# Patient Record
Sex: Male | Born: 2000 | Race: Black or African American | Hispanic: No | Marital: Single | Smoking: Never smoker
Health system: Southern US, Community
[De-identification: ages and names within clinical notes are randomized; demographics above are authoritative.]

## PROBLEM LIST (undated history)

## (undated) DIAGNOSIS — R569 Unspecified convulsions: Secondary | ICD-10-CM

---
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
## 2018-05-28 ENCOUNTER — Encounter (HOSPITAL_BASED_OUTPATIENT_CLINIC_OR_DEPARTMENT_OTHER): Payer: Self-pay | Admitting: *Deleted

## 2018-05-28 ENCOUNTER — Emergency Department (HOSPITAL_BASED_OUTPATIENT_CLINIC_OR_DEPARTMENT_OTHER): Payer: Medicaid Other

## 2018-05-28 ENCOUNTER — Emergency Department (HOSPITAL_BASED_OUTPATIENT_CLINIC_OR_DEPARTMENT_OTHER)
Admission: EM | Admit: 2018-05-28 | Discharge: 2018-05-28 | Disposition: A | Discharge status: Home / Self Care | Payer: Medicaid Other | Attending: Emergency Medicine | Admitting: Emergency Medicine

## 2018-05-28 ENCOUNTER — Other Ambulatory Visit: Payer: Self-pay

## 2018-05-28 DIAGNOSIS — J36 Peritonsillar abscess: Secondary | ICD-10-CM | POA: Diagnosis not present

## 2018-05-28 DIAGNOSIS — J029 Acute pharyngitis, unspecified: Secondary | ICD-10-CM | POA: Diagnosis present

## 2018-05-28 DIAGNOSIS — J039 Acute tonsillitis, unspecified: Secondary | ICD-10-CM

## 2018-05-28 DIAGNOSIS — Z79899 Other long term (current) drug therapy: Secondary | ICD-10-CM | POA: Diagnosis not present

## 2018-05-28 HISTORY — DX: Unspecified convulsions: R56.9

## 2018-05-28 LAB — CBC WITH DIFFERENTIAL/PLATELET
BASOS ABS: 0 10*3/uL (ref 0.0–0.1)
Basophils Relative: 0 %
EOS ABS: 0 10*3/uL (ref 0.0–0.7)
Eosinophils Relative: 0 %
HCT: 45.3 % (ref 39.0–52.0)
HEMOGLOBIN: 16.4 g/dL (ref 13.0–17.0)
Lymphocytes Relative: 7 %
Lymphs Abs: 1.3 10*3/uL (ref 0.7–4.0)
MCH: 29 pg (ref 26.0–34.0)
MCHC: 36.2 g/dL — AB (ref 30.0–36.0)
MCV: 80 fL (ref 78.0–100.0)
MONOS PCT: 8 %
Monocytes Absolute: 1.5 10*3/uL — ABNORMAL HIGH (ref 0.1–1.0)
NEUTROS ABS: 15.6 10*3/uL — AB (ref 1.7–7.7)
Neutrophils Relative %: 85 %
Platelets: 299 10*3/uL (ref 150–400)
RBC: 5.66 MIL/uL (ref 4.22–5.81)
RDW: 11.8 % (ref 11.5–15.5)
WBC: 18.4 10*3/uL — AB (ref 4.0–10.5)

## 2018-05-28 LAB — BASIC METABOLIC PANEL
Anion gap: 10 (ref 5–15)
BUN: 11 mg/dL (ref 6–20)
CALCIUM: 9.9 mg/dL (ref 8.9–10.3)
CO2: 27 mmol/L (ref 22–32)
Chloride: 100 mmol/L (ref 98–111)
Creatinine, Ser: 1.3 mg/dL — ABNORMAL HIGH (ref 0.61–1.24)
GLUCOSE: 102 mg/dL — AB (ref 70–99)
POTASSIUM: 3.9 mmol/L (ref 3.5–5.1)
SODIUM: 137 mmol/L (ref 135–145)

## 2018-05-28 LAB — RAPID STREP SCREEN (MED CTR MEBANE ONLY): STREPTOCOCCUS, GROUP A SCREEN (DIRECT): NEGATIVE

## 2018-05-28 MED ORDER — DEXAMETHASONE SODIUM PHOSPHATE 10 MG/ML IJ SOLN
10.0000 mg | Freq: Once | INTRAMUSCULAR | Status: AC
  Administered 2018-05-28: 10 mg via INTRAVENOUS
  Filled 2018-05-28: qty 1

## 2018-05-28 MED ORDER — CLINDAMYCIN HCL 150 MG PO CAPS: 450.0000 mg | ORAL_CAPSULE | Freq: Three times a day (TID) | ORAL | 0 refills | Status: AC

## 2018-05-28 MED ORDER — IOPAMIDOL (ISOVUE-300) INJECTION 61%
100.0000 mL | Freq: Once | INTRAVENOUS | Status: AC | PRN
  Administered 2018-05-28: 75 mL via INTRAVENOUS

## 2018-05-28 MED ORDER — SODIUM CHLORIDE 0.9 % IV BOLUS
1000.0000 mL | Freq: Once | INTRAVENOUS | Status: AC
  Administered 2018-05-28: 1000 mL via INTRAVENOUS

## 2018-05-28 MED ORDER — CLINDAMYCIN PHOSPHATE 600 MG/50ML IV SOLN
600.0000 mg | Freq: Once | INTRAVENOUS | Status: AC
  Administered 2018-05-28: 600 mg via INTRAVENOUS

## 2018-05-28 NOTE — ED Triage Notes (Signed)
Pt c/o sore throat x 6 days

## 2018-05-28 NOTE — ED Notes (Signed)
Request from radiology for cd of ct scan

## 2018-05-28 NOTE — ED Notes (Signed)
Patient transported to CT 

## 2018-05-28 NOTE — ED Notes (Signed)
ED Provider at bedside. 

## 2018-05-28 NOTE — ED Notes (Signed)
Family at bedside. 

## 2018-05-28 NOTE — ED Provider Notes (Signed)
[facility] EMERGENCY DEPARTMENT Provider Note   CSN: [id number] Arrival date & time: 05/28/18  1746     History   Chief Complaint Chief Complaint  Patient presents with  . Sore Throat    HPI Dylan Rivera is a 17 y.o. male with past medical history of seizures who presents for evaluation of sore throat x6 days.  Patient reports the pain has been grossly worsened over the last 6 days prompting ED visit.  He states he is taking Tylenol which is provided minimal improvement of symptoms.  Patient reports that over the last few days, symptoms have worsened.  He states that it is painful to swallow and states that he has had to spit out his secretions occasionally because he has difficulty swallowing them.  He states he has had decreased appetite secondary to pain with swallowing.  He also reports difficulty opening his mouth secondary to symptoms.  Mom reports changes in his voice over the last few days.  He states he has had some subjective fever but has not measured anything at home.  Patient denies any difficulty breathing.  The history is provided by the patient.    Past Medical History:  Diagnosis Date  . Seizures (HCC)     There are no active problems to display for this patient.   History reviewed. No pertinent surgical history.      Home Medications    Prior to Admission medications   Medication Sig Start Date End Date Taking? Authorizing Provider  acetaminophen (TYLENOL) 500 MG tablet Take 500 mg by mouth every 6 (six) hours as needed.   Yes [provider]  fluticasone (FLONASE) 50 MCG/ACT nasal spray Place 2 sprays into both nostrils daily.   Yes [provider]  clindamycin (CLEOCIN) 150 MG capsule Take 3 capsules (450 mg total) by mouth 3 (three) times daily for 7 days. 05/28/18 06/04/18  Maxwell CaulLayden, Lindsey A, PA-C    Family History History reviewed. No pertinent family history.  Social History Social History   Tobacco Use  . Smoking  status: Never Smoker  . Smokeless tobacco: Never Used  Substance Use Topics  . Alcohol use: Never    Frequency: Never  . Drug use: Never     Allergies   Patient has no known allergies.   Review of Systems Review of Systems  Constitutional: Positive for appetite change. Negative for fever.  HENT: Positive for drooling, sore throat, trouble swallowing and voice change.   Respiratory: Negative for shortness of breath.   Gastrointestinal: Negative for vomiting.     Physical Exam Updated Vital Signs BP (!) 142/83 (BP Location: Left Arm)   Pulse 85   Temp 99 F (37.2 C)   Resp 16   Ht 6\' 2"  (1.88 m)   Wt 83.9 kg (185 lb)   SpO2 100%   BMI 23.75 kg/m   Physical Exam  Constitutional: He appears well-developed and well-nourished.  HENT:  Head: Normocephalic and atraumatic.  Mouth/Throat: Posterior oropharyngeal edema and posterior oropharyngeal erythema present.  Phonation sounds slightly muffled.  Airways patent.  Minute evaluation of posterior oropharynx secondary to patient's cooperation with exam but does appear slightly edematous and erythematous.  Question deviation of uvula.  Tongue or lip swelling.  Eyes: Conjunctivae and EOM are normal. Right eye exhibits no discharge. Left eye exhibits no discharge. No scleral icterus.  Pulmonary/Chest: Effort normal.  Neurological: He is alert.  Skin: Skin is warm and dry.  Psychiatric: He has a normal mood and  affect. His speech is normal and behavior is normal.  Nursing note and vitals reviewed.    ED Treatments / Results  Labs (all labs ordered are listed, but only abnormal results are displayed) Labs Reviewed  BASIC METABOLIC PANEL - Abnormal; Notable for the following components:      Result Value   Glucose, Bld 102 (*)    Creatinine, Ser 1.30 (*)    All other components within normal limits  CBC WITH DIFFERENTIAL/PLATELET - Abnormal; Notable for the following components:   WBC 18.4 (*)    MCHC 36.2 (*)    Neutro  Abs 15.6 (*)    Monocytes Absolute 1.5 (*)    All other components within normal limits  RAPID STREP SCREEN ([facility] ONLY)  CULTURE, GROUP A STREP [facility])    EKG None  Radiology Ct Soft Tissue Neck W Contrast  Result Date: 05/28/2018 CLINICAL DATA:  Throat pain and difficulty swallowing. EXAM: CT NECK WITH CONTRAST TECHNIQUE: Multidetector CT imaging of the neck was performed using the standard protocol following the bolus administration of intravenous contrast. CONTRAST:  75mL ISOVUE-300 IOPAMIDOL (ISOVUE-300) INJECTION 61% COMPARISON:  Head CT 12/06/2017 FINDINGS: PHARYNX AND LARYNX: --Nasopharynx: Fossae of Rosenmuller are clear. Normal adenoid tonsils for age. --Oral cavity and oropharynx: The left palatine tonsil is markedly enlarged with heterogeneous internal density including a developing collection measuring 1.8 x 1.0 cm. Lingual tonsils are also enlarged, but without fluid collection. --Hypopharynx: Normal vallecula and pyriform sinuses. --Larynx: Normal epiglottis and pre-epiglottic space. Normal aryepiglottic and vocal folds. --Retropharyngeal space: No abscess, effusion or lymphadenopathy. SALIVARY GLANDS: --Parotid: No mass lesion or inflammation. No sialolithiasis or ductal dilatation. --Submandibular: Symmetric without inflammation. No sialolithiasis or ductal dilatation. --Sublingual: Normal. No ranula or other visible lesion of the base of tongue and floor of mouth. THYROID: Normal. LYMPH NODES: Reactive level 2A lymph nodes measure up to 10 mm. VASCULAR: Major cervical vessels are patent. LIMITED INTRACRANIAL: Normal. VISUALIZED ORBITS: Normal. MASTOIDS AND VISUALIZED PARANASAL SINUSES: No fluid levels or advanced mucosal thickening. No mastoid effusion. SKELETON: No bony spinal canal stenosis. No lytic or blastic lesions. UPPER CHEST: Clear. OTHER: None. IMPRESSION: Left palatine tonsillitis with 18 x 10 mm collection, consistent with developing peritonsillar abscess. No airway  compromise. Electronically Signed   By: Deatra Robinson M.D.   On: 05/28/2018 19:56    Procedures Procedures (including critical care time)  Medications Ordered in ED Medications  dexamethasone (DECADRON) injection 10 mg (10 mg Intravenous Given 05/28/18 1845)  sodium chloride 0.9 % bolus 1,000 mL (0 mLs Intravenous Stopped 05/28/18 2015)  iopamidol (ISOVUE-300) 61 % injection 100 mL (75 mLs Intravenous Contrast Given 05/28/18 1904)  clindamycin (CLEOCIN) IVPB 600 mg (0 mg Intravenous Stopped 05/28/18 2034)     Initial Impression / Assessment and Plan / ED Course  I have reviewed the triage vital signs and the nursing notes.  Pertinent labs & imaging results that were available during my care of the patient were reviewed by me and considered in my medical decision making (see chart for details).     17 year old male who presents for evaluation of sore throat x6 days.  Reports subjective fevers at home but states he has not measured a temperature.  Reports he has had some difficulty swallowing and states over last few days, he has had to spit out his secretions.  Mom reports that he has had a voice change.  No difficulty breathing, vomiting. Patient is afebrile, non-toxic appearing, sitting comfortably on examination table.  Vital signs reviewed and stable.  On exam, limited evaluation of posterior oropharynx secondary this patient's ability to cooperate with exam.  On my limited evaluation, posterior oropharynx appears erythematous and edematous.  Question deviation of the uvula.  Oral angioedema.  Phonation does sound slightly muffled.  Consider rapid strep.  Also consider peritonsillar abscess.  History/physical exam is not concerning for Ludwig angina.  Rapid strep ordered at triage.  Will also proceed with basic labs, CT soft tissue of neck for evaluation of peritonsillar abscess.  Rapid strep negative.  CBC shows leukocytosis of 18.4.  BMP shows creatinine at 1.30.  Otherwise  unremarkable.  CT soft tissue neck shows left tonsillitis with developing 1.8 x 1.0 cm collection consistent with developing peritonsillar abscess.  Updated patient on results.  He reports improvement in symptoms after fluids and steroids.  P.o. challenge patient.  He is able to swallow his secretions and swallow liquids without any difficulty.  Will start patient on IV clindamycin.  Vital signs are stable.  Patient is tolerating secretions and liquids here in the department.  Do not feel that patient needs admission or requires immediate drainage of the peritonsillar abscess.  Will consult ENT for further recommendation and outpatient follow-up.  Discussed patient with Dr. Pollyann Kennedy (ENT).  Recommend starting patient on p.o. clindamycin and will have him follow-up in his office tomorrow.  Updated patient on plan.  He is agreeable.  Patient is tolerating PO fluids here in the department without any difficulty.  Airway is patent.  He reports improvement in symptoms after steroids here in the department.  Instructed patient to follow-up with referred ENT doctor tomorrow morning.  Patient is agreeable.  Vital signs are stable.  Patient stable for discharge at this time.  We will plan to send home with p.o. clindamycin for further treatment. Patient had ample opportunity for questions and discussion. All patient's questions were answered with full understanding. Strict return precautions discussed. Patient expresses understanding and agreement to plan.   Final Clinical Impressions(s) / ED Diagnoses   Final diagnoses:  Tonsillitis  Peritonsillar abscess    ED Discharge Orders        Ordered    clindamycin (CLEOCIN) 150 MG capsule  3 times daily     05/28/18 2037       Maxwell Caul, PA-C 05/28/18 2055    Melene Plan, DO 05/28/18 2112

## 2018-05-28 NOTE — Discharge Instructions (Addendum)
You can take Tylenol or Ibuprofen as directed for pain. You can alternate Tylenol and Ibuprofen every 4 hours. If you take Tylenol at 1pm, then you can take Ibuprofen at 5pm. Then you can take Tylenol again at 9pm.   Take antibiotics as directed. Please take all of your antibiotics until finished.  Follow-up with referred ENT doctor tomorrow.  Call their office and arrange for an appointment.  Return the emergency department for any fever, difficulty breathing, difficulty swallowing your saliva, difficulty swallowing any food or liquids, worsening pain or any other worsening or concerning symptoms.

## 2018-05-31 LAB — CULTURE, GROUP A STREP (THRC)

## 2018-08-24 ENCOUNTER — Other Ambulatory Visit: Payer: Self-pay

## 2018-08-24 ENCOUNTER — Encounter (HOSPITAL_COMMUNITY)
Admission: EM | Disposition: A | Discharge status: Home / Self Care | Payer: Self-pay | Source: Home / Self Care | Attending: Emergency Medicine

## 2018-08-24 ENCOUNTER — Ambulatory Visit (HOSPITAL_BASED_OUTPATIENT_CLINIC_OR_DEPARTMENT_OTHER)
Admission: EM | Admit: 2018-08-24 | Discharge: 2018-08-24 | Disposition: A | Discharge status: Home / Self Care | Payer: Medicaid Other | Attending: Emergency Medicine | Admitting: Emergency Medicine

## 2018-08-24 ENCOUNTER — Emergency Department (HOSPITAL_BASED_OUTPATIENT_CLINIC_OR_DEPARTMENT_OTHER): Payer: Medicaid Other

## 2018-08-24 ENCOUNTER — Emergency Department (HOSPITAL_COMMUNITY): Payer: Medicaid Other | Admitting: Certified Registered"

## 2018-08-24 ENCOUNTER — Encounter (HOSPITAL_BASED_OUTPATIENT_CLINIC_OR_DEPARTMENT_OTHER): Payer: Self-pay | Admitting: Emergency Medicine

## 2018-08-24 DIAGNOSIS — K651 Peritoneal abscess: Secondary | ICD-10-CM

## 2018-08-24 DIAGNOSIS — J36 Peritonsillar abscess: Secondary | ICD-10-CM | POA: Diagnosis not present

## 2018-08-24 DIAGNOSIS — J029 Acute pharyngitis, unspecified: Secondary | ICD-10-CM | POA: Diagnosis present

## 2018-08-24 HISTORY — PX: INCISION AND DRAINAGE OF PERITONSILLAR ABCESS: SHX6257

## 2018-08-24 LAB — CBC WITH DIFFERENTIAL/PLATELET
BASOS ABS: 0 10*3/uL (ref 0.0–0.1)
BASOS PCT: 0 %
Eosinophils Absolute: 0.1 10*3/uL (ref 0.0–0.7)
Eosinophils Relative: 0 %
HCT: 47.5 % (ref 39.0–52.0)
Hemoglobin: 16.7 g/dL (ref 13.0–17.0)
LYMPHS PCT: 7 %
Lymphs Abs: 1.2 10*3/uL (ref 0.7–4.0)
MCH: 29.1 pg (ref 26.0–34.0)
MCHC: 35.2 g/dL (ref 30.0–36.0)
MCV: 82.9 fL (ref 78.0–100.0)
Monocytes Absolute: 2.4 10*3/uL — ABNORMAL HIGH (ref 0.1–1.0)
Monocytes Relative: 13 %
Neutro Abs: 14.5 10*3/uL — ABNORMAL HIGH (ref 1.7–7.7)
Neutrophils Relative %: 80 %
Platelets: 288 10*3/uL (ref 150–400)
RBC: 5.73 MIL/uL (ref 4.22–5.81)
RDW: 12 % (ref 11.5–15.5)
WBC: 18.2 10*3/uL — AB (ref 4.0–10.5)

## 2018-08-24 LAB — BASIC METABOLIC PANEL
ANION GAP: 8 (ref 5–15)
BUN: 13 mg/dL (ref 6–20)
CALCIUM: 9.7 mg/dL (ref 8.9–10.3)
CO2: 29 mmol/L (ref 22–32)
Chloride: 99 mmol/L (ref 98–111)
Creatinine, Ser: 0.97 mg/dL (ref 0.61–1.24)
GFR calc non Af Amer: >60 mL/min (ref >60)
GLUCOSE: 90 mg/dL (ref 70–99)
POTASSIUM: 3.9 mmol/L (ref 3.5–5.1)
SODIUM: 136 mmol/L (ref 135–145)

## 2018-08-24 LAB — GROUP A STREP BY PCR: GROUP A STREP BY PCR: NOT DETECTED

## 2018-08-24 SURGERY — INCISION AND DRAINAGE, ABSCESS, PERITONSILLAR
Anesthesia: General | Laterality: Left

## 2018-08-24 MED ORDER — CLINDAMYCIN PHOSPHATE 600 MG/50ML IV SOLN
600.0000 mg | Freq: Once | INTRAVENOUS | Status: AC
  Administered 2018-08-24: 600 mg via INTRAVENOUS
  Filled 2018-08-24: qty 50

## 2018-08-24 MED ORDER — DEXAMETHASONE SODIUM PHOSPHATE 10 MG/ML IJ SOLN
10.0000 mg | Freq: Once | INTRAMUSCULAR | Status: AC
  Administered 2018-08-24: 10 mg via INTRAVENOUS
  Filled 2018-08-24: qty 1

## 2018-08-24 MED ORDER — HYDROCODONE-ACETAMINOPHEN 7.5-325 MG/15ML PO SOLN: 10.0000 mL | ORAL | Status: DC | PRN

## 2018-08-24 MED ORDER — PROPOFOL 10 MG/ML IV BOLUS
INTRAVENOUS | Status: DC | PRN
  Administered 2018-08-24: 170 mg via INTRAVENOUS

## 2018-08-24 MED ORDER — SODIUM CHLORIDE 0.9 % IV BOLUS
1000.0000 mL | Freq: Once | INTRAVENOUS | Status: AC
  Administered 2018-08-24: 1000 mL via INTRAVENOUS

## 2018-08-24 MED ORDER — FENTANYL CITRATE (PF) 250 MCG/5ML IJ SOLN
INTRAMUSCULAR | Status: AC
  Filled 2018-08-24: qty 5

## 2018-08-24 MED ORDER — MIDAZOLAM HCL 2 MG/2ML IJ SOLN
INTRAMUSCULAR | Status: AC
  Filled 2018-08-24: qty 2

## 2018-08-24 MED ORDER — HYDROCODONE-ACETAMINOPHEN 7.5-325 MG/15ML PO SOLN: 10.0000 mL | ORAL | 0 refills | Status: DC | PRN

## 2018-08-24 MED ORDER — LIDOCAINE 2% (20 MG/ML) 5 ML SYRINGE
INTRAMUSCULAR | Status: DC | PRN
  Administered 2018-08-24: 100 mg via INTRAVENOUS

## 2018-08-24 MED ORDER — PROPOFOL 10 MG/ML IV BOLUS
INTRAVENOUS | Status: AC
  Filled 2018-08-24: qty 20

## 2018-08-24 MED ORDER — FENTANYL CITRATE (PF) 100 MCG/2ML IJ SOLN: 25.0000 ug | INTRAMUSCULAR | Status: DC | PRN

## 2018-08-24 MED ORDER — FENTANYL CITRATE (PF) 100 MCG/2ML IJ SOLN
INTRAMUSCULAR | Status: DC | PRN
  Administered 2018-08-24 (×3): 50 ug via INTRAVENOUS

## 2018-08-24 MED ORDER — AMOXICILLIN 250 MG/5ML PO SUSR: ORAL | 0 refills | Status: DC

## 2018-08-24 MED ORDER — SODIUM CHLORIDE 0.9 % IV SOLN
INTRAVENOUS | Status: DC | PRN
  Administered 2018-08-24: 250 mL via INTRAVENOUS

## 2018-08-24 MED ORDER — AMOXICILLIN 250 MG/5ML PO SUSR: 250.0000 mg | Freq: Three times a day (TID) | ORAL | Status: DC

## 2018-08-24 MED ORDER — ONDANSETRON HCL 4 MG/2ML IJ SOLN
INTRAMUSCULAR | Status: DC | PRN
  Administered 2018-08-24: 4 mg via INTRAVENOUS

## 2018-08-24 MED ORDER — IOPAMIDOL (ISOVUE-300) INJECTION 61%
100.0000 mL | Freq: Once | INTRAVENOUS | Status: AC | PRN
  Administered 2018-08-24: 75 mL via INTRAVENOUS

## 2018-08-24 MED ORDER — MIDAZOLAM HCL 5 MG/5ML IJ SOLN
INTRAMUSCULAR | Status: DC | PRN
  Administered 2018-08-24 (×2): 1 mg via INTRAVENOUS

## 2018-08-24 MED ORDER — LACTATED RINGERS IV SOLN
INTRAVENOUS | Status: DC | PRN
  Administered 2018-08-24: 22:00:00 via INTRAVENOUS

## 2018-08-24 MED ORDER — SUCCINYLCHOLINE CHLORIDE 20 MG/ML IJ SOLN
INTRAMUSCULAR | Status: DC | PRN
  Administered 2018-08-24: 130 mg via INTRAVENOUS

## 2018-08-24 MED ORDER — AMOXICILLIN 250 MG/5ML PO SUSR: 250.0000 mg | Freq: Three times a day (TID) | ORAL | 0 refills | Status: DC

## 2018-08-24 SURGICAL SUPPLY — 26 items
CANISTER SUCT 3000ML PPV (MISCELLANEOUS) ×3 IMPLANT
COAGULATOR SUCT 6 FR SWTCH (ELECTROSURGICAL)
COAGULATOR SUCT SWTCH 10FR 6 (ELECTROSURGICAL) IMPLANT
DRAPE HALF SHEET 40X57 (DRAPES) IMPLANT
ELECT COATED BLADE 2.86 ST (ELECTRODE) ×3 IMPLANT
ELECT REM PT RETURN 9FT ADLT (ELECTROSURGICAL)
ELECTRODE REM PT RTRN 9FT ADLT (ELECTROSURGICAL) IMPLANT
GAUZE 4X4 16PLY RFD (DISPOSABLE) ×3 IMPLANT
GLOVE BIOGEL PI IND STRL 7.0 (GLOVE) ×1 IMPLANT
GLOVE BIOGEL PI INDICATOR 7.0 (GLOVE) ×2
GLOVE ECLIPSE 8.0 STRL XLNG CF (GLOVE) ×3 IMPLANT
GOWN STRL REUS W/ TWL LRG LVL3 (GOWN DISPOSABLE) ×1 IMPLANT
GOWN STRL REUS W/ TWL XL LVL3 (GOWN DISPOSABLE) ×1 IMPLANT
GOWN STRL REUS W/TWL LRG LVL3 (GOWN DISPOSABLE) ×2
GOWN STRL REUS W/TWL XL LVL3 (GOWN DISPOSABLE) ×2
KIT BASIN OR (CUSTOM PROCEDURE TRAY) ×3 IMPLANT
KIT TURNOVER KIT B (KITS) ×3 IMPLANT
NS IRRIG 1000ML POUR BTL (IV SOLUTION) ×3 IMPLANT
PACK SURGICAL SETUP 50X90 (CUSTOM PROCEDURE TRAY) ×3 IMPLANT
PAD ARMBOARD 7.5X6 YLW CONV (MISCELLANEOUS) ×6 IMPLANT
PENCIL FOOT CONTROL (ELECTRODE) ×3 IMPLANT
SYR BULB 3OZ (MISCELLANEOUS) IMPLANT
TOWEL OR 17X24 6PK STRL BLUE (TOWEL DISPOSABLE) ×6 IMPLANT
TUBE CONNECTING 12'X1/4 (SUCTIONS) ×1
TUBE CONNECTING 12X1/4 (SUCTIONS) ×2 IMPLANT
YANKAUER SUCT BULB TIP NO VENT (SUCTIONS) ×3 IMPLANT

## 2018-08-24 NOTE — ED Provider Notes (Signed)
[facility] EMERGENCY DEPARTMENT Provider Note   CSN: [id number] Arrival date & time: 08/24/18  1657     History   Chief Complaint Chief Complaint  Patient presents with  . Sore Throat    HPI Dylan Rivera is a 17 y.o. male presenting for evaluation of sore throat.  Patient states for the past 5 days, he has been having sore throat.  Initially it was was bilateral, pain is now mostly on the left.  He has noticed over the past several days, he has had increasing muffled voice, pain is worsening.  He denies fevers, chills, nausea, or vomiting.  He reports it is getting difficult to swallow due to the pain.  He states this feels similar to 2 months ago, when he had a peritonsillar abscess which had to be drained.  He denies sick contacts.  He denies ear pain, nasal congestion, cough, chest pain, shortness of breath.  He has no medical problems, takes no medications daily.  HPI  Past Medical History:  Diagnosis Date  . Seizures (HCC)     There are no active problems to display for this patient.   History reviewed. No pertinent surgical history.      Home Medications    Prior to Admission medications   Medication Sig Start Date End Date Taking? Authorizing Provider  acetaminophen (TYLENOL) 500 MG tablet Take 500 mg by mouth every 6 (six) hours as needed.    [provider]  fluticasone (FLONASE) 50 MCG/ACT nasal spray Place 2 sprays into both nostrils daily.    [provider]    Family History No family history on file.  Social History Social History   Tobacco Use  . Smoking status: Never Smoker  . Smokeless tobacco: Never Used  Substance Use Topics  . Alcohol use: Never    Frequency: Never  . Drug use: Never     Allergies   Patient has no known allergies.   Review of Systems Review of Systems  HENT: Positive for sore throat, trouble swallowing and voice change.   All other systems reviewed and are negative.    Physical  Exam Updated Vital Signs BP (!) 141/84 (BP Location: Left Arm)   Pulse 86   Temp 99.8 F (37.7 C) (Oral)   Resp 18   Ht 6\' 2"  (1.88 m)   Wt 83.9 kg   SpO2 100%   BMI 23.75 kg/m   Physical Exam  Constitutional: He is oriented to person, place, and time. He appears well-developed and well-nourished. No distress.  Appears nontoxic  HENT:  Head: Normocephalic and atraumatic.  Mouth/Throat: Mucous membranes are normal. There is trismus in the jaw. Posterior oropharyngeal edema and posterior oropharyngeal erythema present. Tonsils are 3+ on the right. Tonsils are 4+ on the left.  Bilateral tonsillar swelling, left worse than right.  Muffled voice noted.  Handling secretions.  Uvula midline.  Left TM mildly erythematous, no bulging.  Right TM without erythema or bulging.  Eyes: Pupils are equal, round, and reactive to light. Conjunctivae and EOM are normal.  Neck: Normal range of motion. Neck supple.  Cardiovascular: Normal rate, regular rhythm and intact distal pulses.  Pulmonary/Chest: Effort normal and breath sounds normal. No respiratory distress. He has no wheezes.  Abdominal: Soft. Bowel sounds are normal. He exhibits no distension. There is no tenderness.  Musculoskeletal: Normal range of motion.  Neurological: He is alert and oriented to person, place, and time.  Skin: Skin is warm and dry. Capillary refill  takes less than 2 seconds.  Psychiatric: He has a normal mood and affect.  Nursing note and vitals reviewed.    ED Treatments / Results  Labs (all labs ordered are listed, but only abnormal results are displayed) Labs Reviewed  CBC WITH DIFFERENTIAL/PLATELET - Abnormal; Notable for the following components:      Result Value   WBC 18.2 (*)    Neutro Abs 14.5 (*)    Monocytes Absolute 2.4 (*)    All other components within normal limits  GROUP A STREP BY PCR  BASIC METABOLIC PANEL    EKG None  Radiology Ct Soft Tissue Neck W Contrast  Result Date:  08/24/2018 CLINICAL DATA:  Sore throat with stridor, tonsillitis suspected. EXAM: CT NECK WITH CONTRAST TECHNIQUE: Multidetector CT imaging of the neck was performed using the standard protocol following the bolus administration of intravenous contrast. CONTRAST:  75mL ISOVUE-300 IOPAMIDOL (ISOVUE-300) INJECTION 61% COMPARISON:  05/28/2018 FINDINGS: Pharynx and larynx: BILATERAL tonsillar enlargement, greater on the LEFT. Developing peritonsillar abscess, measuring roughly 2 x 3 x 3 cm. Parapharyngeal fat is effaced. The lesion extends cephalad towards the nasopharynx. Soft palate and uvula are edematous. Epiglottis is intact. No retropharyngeal fluid collection or airway compromise. Salivary glands: No inflammation, mass, or stone. Thyroid: Normal. Lymph nodes: Reactive cervical lymph nodes, greatest in LEFT level II. Vascular: Negative. Limited intracranial: Negative. Visualized orbits: Negative. Mastoids and visualized paranasal sinuses: Clear. Skeleton: No acute or aggressive process. Upper chest: Negative. Other: None. IMPRESSION: BILATERAL tonsillar enlargement and inflammation. Developing LEFT peritonsillar abscess, larger than priors, roughly 2 x 3 x 3 cm. ENT consultation is warranted. These results were called by telephone at the time of interpretation on 08/24/2018 at 6:42 pm to [facility] , who verbally acknowledged these results. Electronically Signed   By: Elsie Stain M.D.   On: 08/24/2018 18:49    Procedures Procedures (including critical care time)  Medications Ordered in ED Medications  0.9 %  sodium chloride infusion (250 mLs Intravenous New Bag/Given 08/24/18 1752)  sodium chloride 0.9 % bolus 1,000 mL (0 mLs Intravenous Stopped 08/24/18 1858)  clindamycin (CLEOCIN) IVPB 600 mg (0 mg Intravenous Stopped 08/24/18 1858)  dexamethasone (DECADRON) injection 10 mg (10 mg Intravenous Given 08/24/18 1746)  iopamidol (ISOVUE-300) 61 % injection 100 mL (75 mLs Intravenous Contrast Given  08/24/18 1812)     Initial Impression / Assessment and Plan / ED Course  I have reviewed the triage vital signs and the nursing notes.  Pertinent labs & imaging results that were available during my care of the patient were reviewed by me and considered in my medical decision making (see chart for details).     Patient presenting for evaluation of sore throat.  Physical exam concerning, patient with muffled voice and significantly swollen tonsils.  Recent history of a peritonsillar abscess.  Patient states this feels identical to previous peritonsillar abscess.  Will obtain labs, CT neck, start fluids, Decadron, and antibiotics.  Labs show elevated white count of 18.2.  Otherwise reassuring.  Patient remains afebrile and in no distress.  CT shows large left-sided peritonsillar abscess with bilateral tonsillar swelling. Discussed with Dr. Cloria Spring from ENT, who recommends transfer to [facility] for drainage. On reassessment, pt states he feels improved with medications. Pt is agreeable to tranfer to [facility]. Pt wants to go POV, he has remained stable in the ER and I am agreeable to this plan.  Discussed with Dr. Dalene Seltzer at [facility]   Pt instructed to go directly to  [facility].   Final Clinical Impressions(s) / ED Diagnoses   Final diagnoses:  None    ED Discharge Orders    None       Alveria Apley, PA-C 08/24/18 2012    Rolan Bucco, MD 08/24/18 2025

## 2018-08-24 NOTE — Anesthesia Procedure Notes (Signed)
Procedure Name: Intubation Date/Time: 08/24/2018 10:09 PM Performed by: Edmonia CaprioAuston, Cesily Cuoco M, CRNA Pre-anesthesia Checklist: Patient identified, Emergency Drugs available, Suction available, Patient being monitored and Timeout performed Patient Re-evaluated:Patient Re-evaluated prior to induction Oxygen Delivery Method: Circle system utilized Preoxygenation: Pre-oxygenation with 100% oxygen Induction Type: IV induction, Rapid sequence and Cricoid Pressure applied Laryngoscope Size: Glidescope and 3 Grade View: Grade I Tube type: Oral Tube size: 7.0 mm Number of attempts: 1 Airway Equipment and Method: Stylet Placement Confirmation: ETT inserted through vocal cords under direct vision,  positive ETCO2 and breath sounds checked- equal and bilateral Secured at: 23 cm Tube secured with: Tape Dental Injury: Teeth and Oropharynx as per pre-operative assessment

## 2018-08-24 NOTE — Transfer of Care (Signed)
Immediate Anesthesia Transfer of Care Note  Patient: [name], [name]  Procedure(s) Performed: INCISION AND DRAINAGE OF PERITONSILLAR LEFT ABCESS (Left )  Patient Location: PACU  Anesthesia Type:General  Level of Consciousness: sedated  Airway & Oxygen Therapy: Patient Spontanous Breathing and Patient connected to nasal cannula oxygen  Post-op Assessment: Report given to RN and Post -op Vital signs reviewed and stable  Post vital signs: Reviewed and stable  Last Vitals:  Vitals Value Taken Time  BP    Temp    Pulse    Resp    SpO2      Last Pain:  Vitals:   08/24/18 1929  TempSrc:   PainSc: 6          Complications: No apparent anesthesia complications

## 2018-08-24 NOTE — ED Notes (Signed)
ED Provider at bedside. 

## 2018-08-24 NOTE — Anesthesia Preprocedure Evaluation (Signed)
Anesthesia Evaluation  Patient identified by MRN, date of birth, ID band Patient awake    Reviewed: Allergy & Precautions, NPO status , Patient's Chart, lab work & pertinent test results  Airway Mallampati: II  TM Distance: >3 FB   Mouth opening: Limited Mouth Opening  Dental   Pulmonary    breath sounds clear to auscultation       Cardiovascular negative cardio ROS   Rhythm:Regular Rate:Normal     Neuro/Psych Seizures -,     GI/Hepatic negative GI ROS, Neg liver ROS,   Endo/Other  negative endocrine ROS  Renal/GU negative Renal ROS     Musculoskeletal   Abdominal   Peds  Hematology   Anesthesia Other Findings   Reproductive/Obstetrics                             Anesthesia Physical Anesthesia Plan  ASA: I and emergent  Anesthesia Plan: General   Post-op Pain Management:    Induction: Intravenous  PONV Risk Score and Plan: Treatment may vary due to age or medical condition and Ondansetron  Airway Management Planned: Oral ETT  Additional Equipment:   Intra-op Plan:   Post-operative Plan: Extubation in OR  Informed Consent:   Dental advisory given  Plan Discussed with: CRNA and Anesthesiologist  Anesthesia Plan Comments:         Anesthesia Quick Evaluation

## 2018-08-24 NOTE — Consult Note (Signed)
Dylan Rivera,  Dylan Rivera 17 y.o., male [id number]     Chief Complaint: severe sore throat  HPI: 17 yo bm, onset LEFT sore throat 5 days ago.  Progressive.  Seen in [facility] earlier today.  CT shows large LEFT peritonsillar abscess.  No prior therapy this episode.  Rec'd Clinda, Decadron, and IV fluids .  WBC 18K.  Previous LEFT peritonsillar abscess 2-3 months ago, I&D ?Dr. Constance Holster.  Minimal past hx tonsillitis or strep throat.  No sleep apnea.    PMH: Past Medical History:  Diagnosis Date  . Seizures ([place])     Surg JJ:HERDEYC reviewed. No pertinent surgical history.  FHx:  No family history on file. SocHx:  reports that he has never smoked. He has never used smokeless tobacco. He reports that he does not drink alcohol or use drugs.  ALLERGIES: No Known Allergies   (Not in a hospital admission)  Results for orders placed or performed during the hospital encounter of 08/24/18 (from the past 48 hour(s))  Group A Strep by PCR     Status: None   Collection Time: 08/24/18  5:08 PM  Result Value Ref Range   Group A Strep by PCR NOT DETECTED NOT DETECTED    Comment: Performed at [facility], [address] [place], [place] [address]  CBC with Differential     Status: Abnormal   Collection Time: 08/24/18  5:40 PM  Result Value Ref Range   WBC 18.2 (H) 4.0 - 10.5 K/uL   RBC 5.73 4.22 - 5.81 MIL/uL   Hemoglobin 16.7 13.0 - 17.0 g/dL   HCT 47.5 39.0 - 52.0 %   MCV 82.9 78.0 - 100.0 fL   MCH 29.1 26.0 - 34.0 pg   MCHC 35.2 30.0 - 36.0 g/dL   RDW 12.0 11.5 - 15.5 %   Platelets 288 150 - 400 K/uL   Neutrophils Relative % 80 %   Neutro Abs 14.5 (H) 1.7 - 7.7 K/uL   Lymphocytes Relative 7 %   Lymphs Abs 1.2 0.7 - 4.0 K/uL   Monocytes Relative 13 %   Monocytes Absolute 2.4 (H) 0.1 - 1.0 K/uL   Eosinophils Relative 0 %   Eosinophils Absolute 0.1 0.0 - 0.7 K/uL   Basophils Relative 0 %   Basophils Absolute 0.0 0.0 - 0.1 K/uL    Comment: Performed at [facility], [address] [place], [place] [address]  Basic metabolic panel     Status: None   Collection Time: 08/24/18  5:40 PM  Result Value Ref Range   Sodium 136 135 - 145 mmol/L   Potassium 3.9 3.5 - 5.1 mmol/L   Chloride 99 98 - 111 mmol/L   CO2 29 22 - 32 mmol/L   Glucose, Bld 90 70 - 99 mg/dL   BUN 13 6 - 20 mg/dL   Creatinine, Ser 0.97 0.61 - 1.24 mg/dL   Calcium 9.7 8.9 - 10.3 mg/dL   GFR calc non Af Amer >60 >60 mL/min   GFR calc Af Amer >60 >60 mL/min    Comment: (NOTE) The eGFR has been calculated using the CKD EPI equation. This calculation has not been validated in all clinical situations. eGFR's persistently <60 mL/min signify possible Chronic Kidney Disease.    Anion gap 8 5 - 15    Comment: Performed at [facility], [address] [place], [place] [address]   Ct Soft Tissue Neck W Contrast  Result  Date: 08/24/2018 CLINICAL DATA:  Sore throat with stridor, tonsillitis suspected. EXAM: CT NECK WITH CONTRAST TECHNIQUE: Multidetector CT imaging of the neck was performed using the standard protocol following the bolus administration of intravenous contrast. CONTRAST:  66m ISOVUE-300 IOPAMIDOL (ISOVUE-300) INJECTION 61% COMPARISON:  05/28/2018 FINDINGS: Pharynx and larynx: BILATERAL tonsillar enlargement, greater on the LEFT. Developing peritonsillar abscess, measuring roughly 2 x 3 x 3 cm. Parapharyngeal fat is effaced. The lesion extends cephalad towards the nasopharynx. Soft palate and uvula are edematous. Epiglottis is intact. No retropharyngeal fluid collection or airway compromise. Salivary glands: No inflammation, mass, or stone. Thyroid: Normal. Lymph nodes: Reactive cervical lymph nodes, greatest in LEFT level II. Vascular: Negative. Limited intracranial: Negative. Visualized orbits: Negative. Mastoids and visualized paranasal sinuses: Clear. Skeleton: No acute or aggressive process. Upper chest: Negative. Other: None. IMPRESSION: BILATERAL tonsillar  enlargement and inflammation. Developing LEFT peritonsillar abscess, larger than priors, roughly 2 x 3 x 3 cm. ENT consultation is warranted. These results were called by telephone at the time of interpretation on 08/24/2018 at 6:42 pm to [facility], who verbally acknowledged these results. Electronically Signed   By: JStaci RighterM.D.   On: 08/24/2018 18:49     Blood pressure 139/76, pulse 85, temperature 99.8 F (37.7 C), temperature source Oral, resp. rate 16, height '6\' 2"'  (1.88 m), weight 83.9 kg, SpO2 99 %.  PHYSICAL EXAM: Overall appearance:  Mildly distressed.  Sl "hot potato" voice. No trismus Head: NCAT Ears:  clear Nose:  clear Oral Cavity: teeth in good repair.   Oral Pharynx/Hypopharynx/Larynx:   2+ tonsils.asymmetric swelling and bulging of LEFT soft palate with uvular deviation.   Neuro: grossly intact Neck: tender JDG nodes LEFT  Studies Reviewed: CT neck    Assessment/Plan  LEFT peritonsillar abscess.  Plan:  Recommend I&D tonight.  Discussed with pt and father.  Risks and complications discussed.  Informed consent obtained.  Will send him home with po Hydrocodone liquid and po Amoxicillin.  Recheck  My office 2 weeks.  Will need tonsillectomy reasonably soon so this does not happen to him further.  We will work around his college schedule.    WJodi [id number] 9:10 PM

## 2018-08-24 NOTE — ED Notes (Signed)
Wolicki aware that [facility]

## 2018-08-24 NOTE — ED Notes (Signed)
Called Woody, Consulting civil engineerCharge RN, gave report about patient coming POV

## 2018-08-24 NOTE — Discharge Instructions (Addendum)
[facility] are being sent to [facility] emergency department to see the ear nose and throat doctor to get your peritonsillar abscess drained. Go directly to the ER.  We drained a LEFT peritonsillar abscess today.  Drink plenty of fluids. Advance to solid foods when comfortable Hydrocodone alternating with Ibuprofen for pain relief Amoxicillin antibiotics until gone OK to resume strenuous activity Monday Skip football practice on 22 SEP Recheck my office 2 weeks please, [phone] Call for excessive pain, active bleeding You should have your tonsils and adenoids removed at some point.  You will miss about 10 days of school afterwards.

## 2018-08-24 NOTE — Anesthesia Postprocedure Evaluation (Signed)
Anesthesia Post Note  Patient: [name], museum/galleryJevon Hendler  Procedure(s) Performed: INCISION AND DRAINAGE OF PERITONSILLAR LEFT ABCESS (Left )     Patient location during evaluation: PACU Anesthesia Type: General Level of consciousness: awake Pain management: pain level controlled Vital Signs Assessment: post-procedure vital signs reviewed and stable Respiratory status: spontaneous breathing Cardiovascular status: stable Postop Assessment: no apparent nausea or vomiting Anesthetic complications: no    Last Vitals:  Vitals:   08/24/18 2240 08/24/18 2255  BP: 135/79 (!) 140/94  Pulse: 88   Resp: 16   Temp: 36.9 C   SpO2: 100%     Last Pain:  Vitals:   08/24/18 2255  TempSrc:   PainSc: 0-No pain                 Ioana Louks

## 2018-08-24 NOTE — ED Provider Notes (Signed)
  Physical Exam  BP (!) 143/83   Pulse 85   Temp 99.8 F (37.7 C) (Oral)   Resp 16   Ht 6\' 2"  (1.88 m)   Wt 83.9 kg   SpO2 99%   BMI 23.75 kg/m   Physical Exam  Constitutional: He appears well-developed and well-nourished.  HENT:  Head: Normocephalic.  Mouth/Throat: There is trismus in the jaw.  Unable to visualize oropharyngeal region, trismus noted on exam.   Cardiovascular: Normal rate.  Pulmonary/Chest: Effort normal and breath sounds normal.  Abdominal: Soft.  Skin: Skin is warm and dry.  Nursing note and vitals reviewed.   ED Course/Procedures     Procedures  MDM  [facility] Patient CT showed lateral tonsillar enlargement and inflammation.  Developing left peritonsillar abscess, larger than priors, roughly 2 x 3 x 3 cm.  He has been consulted and is aware the patient here.  Dr. Lazarus SalinesWolicki to come see patient.  Dr. Lazarus SalinesWolicki to take patient to the OR.      Claude MangesSoto, Lariyah Shetterly, PA-C 08/24/18 2203    Alvira MondaySchlossman, Erin, MD 08/25/18 1306

## 2018-08-24 NOTE — Op Note (Signed)
08/24/2018 10:30 PM    Girgis, Sharmaine BaseJevon  [id number]   Pre-Op Dx:  LEFT Peritonsillar Abscess  Post-op Dx: same  Proc:  I&D LEFT PTA  Surg:  Flo ShanksWOLICKI, Makynli Stills T MD  Anes:  GOT  EBL:  MIN  Comp:  NONE  Findings:  2+ tonsils.  Mod LEFT mid pole abscess.  Procedure: With the patient in a comfortable supine position, GOT was administered in standard fashion.    At an appropriate level,  the table was turned 90 degrees away from anesthesia  and placed in Trendelenberg position. Routine clean preparation and draping was performed. Taking care to protect lips, teeth and endotracheal tube, the Crowe-Davis mouth gag was introduced, expanded for visualization, and suspended from the Mayo stand in the standard fashion.  The findings were as described as above.  Electrocautery was used to perform a crescent incision above the LEFT tonsil.  This was carried down on the capsule of the tonsil.  A frank abscess cavity was encountered, and widely opened.  Hemostasis was spontaneous.  The cavity was irrigated with sterile saline.  The mouth gag was relaxed for several minutes.  Upon re-expansion, hemostasis was observed.  At this point the procedure was completed.  The mouth gag was relaxed and removed.  The dental status was  Intact.  The patient was returned to Anesthesia, awakened, extubated, and transferred to PACU in stable condition.    Dispo:   PACU to Home  Plan:  Hydration, antibiosis, analgesia.   Given low anticipated risk of post-anesthetic or post-surgical complications, feel an outpatient venue is appropriate.  Cephus RicherWOLICKI,  Tannar Broker T MD

## 2018-08-24 NOTE — ED Triage Notes (Signed)
Sore throat x 5 days, recent hx of peritonsillar abscess.

## 2018-08-24 NOTE — ED Notes (Signed)
Patient transported to CT 

## 2018-08-25 ENCOUNTER — Encounter (HOSPITAL_COMMUNITY): Payer: Self-pay | Admitting: Otolaryngology

## 2019-01-07 IMAGING — CT CT NECK W/ CM
4 of 5 series · 13 of 33 positions shown, 15 images · IV contrast (iopamidol)
Comparison: Head CT 12/06/2017

CLINICAL DATA: Throat pain and difficulty swallowing.

EXAM:
CT NECK WITH CONTRAST
TECHNIQUE: Multidetector CT imaging of the neck was performed using the
standard protocol following the bolus administration of intravenous
contrast.
CONTRAST:  75mL MTDAI4-JUU IOPAMIDOL (MTDAI4-JUU) INJECTION 61%

[Series 3: axial neck · axial · 0.61mm/px · z∈[+800,+984]mm · 4 of 154 slices shown, 5 images]
[im 31/154  soft-tissue]
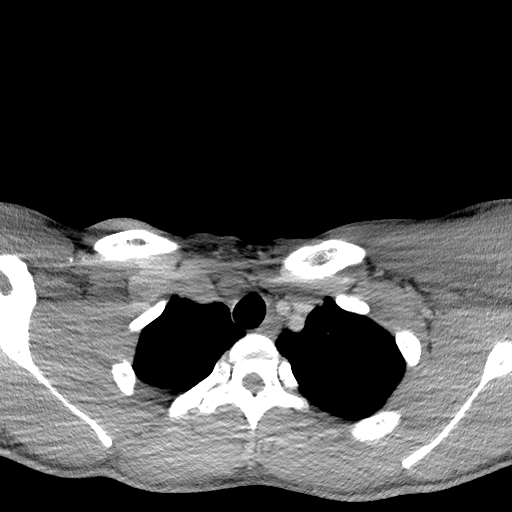
[im 31/154  bone]
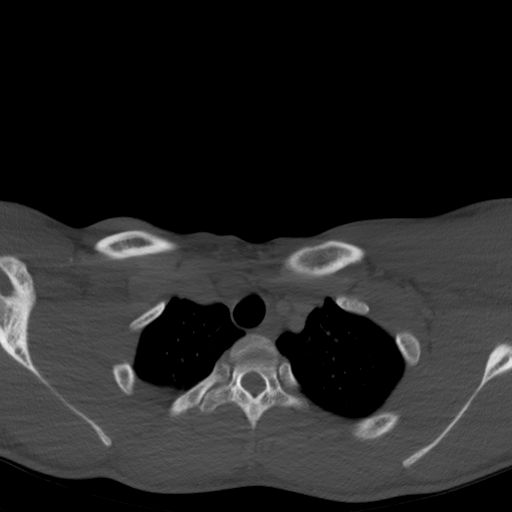
[im 62/154  bone]
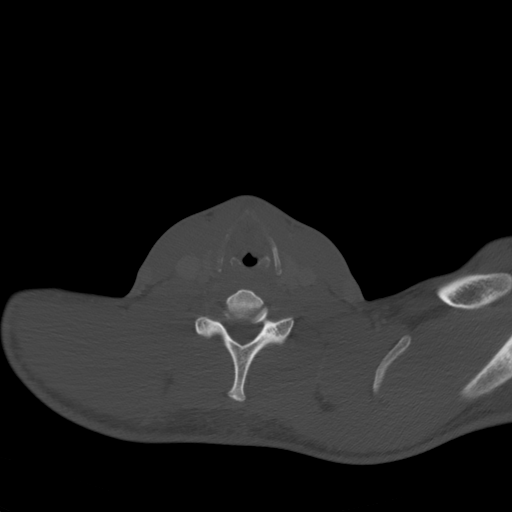
[im 92/154  bone]
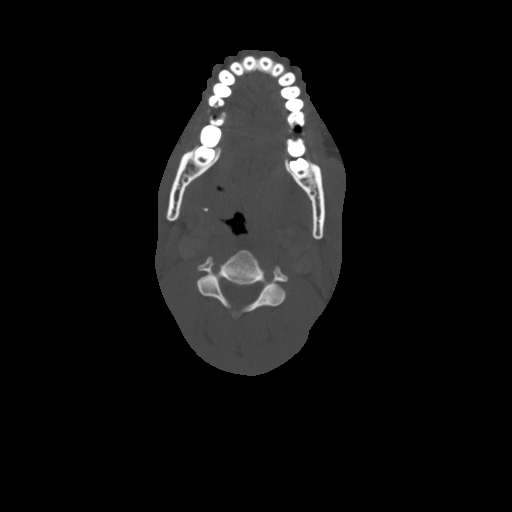
[im 123/154  bone]
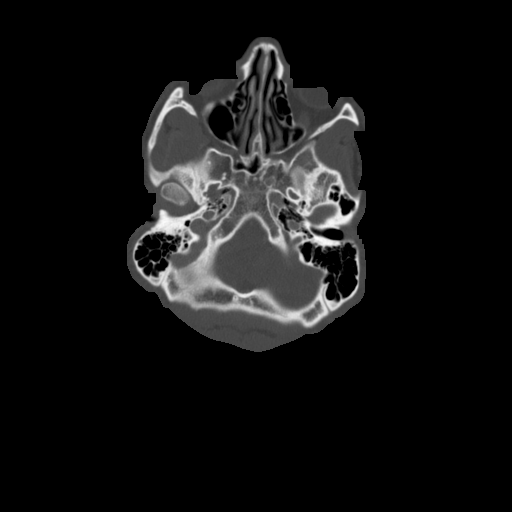

[Series 7: sag neck · sagittal · 0.60mm/px · 5 of 104 slices shown, 6 images]
[im 35/104  bone]
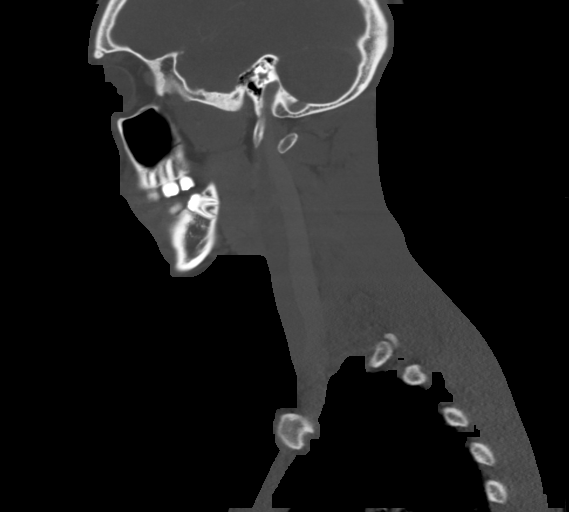
[im 43/104  bone]
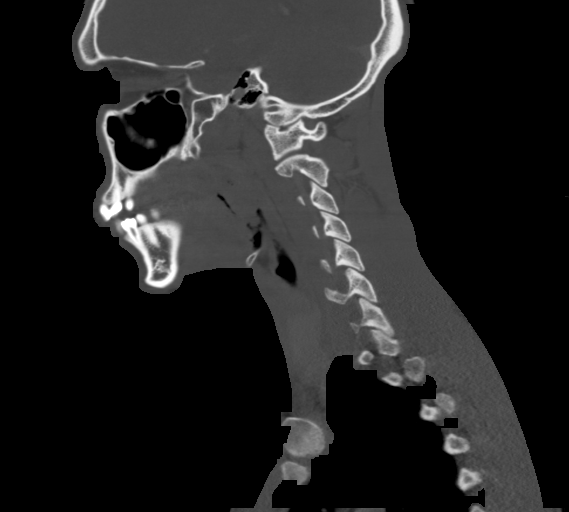
[im 52/104  soft-tissue]
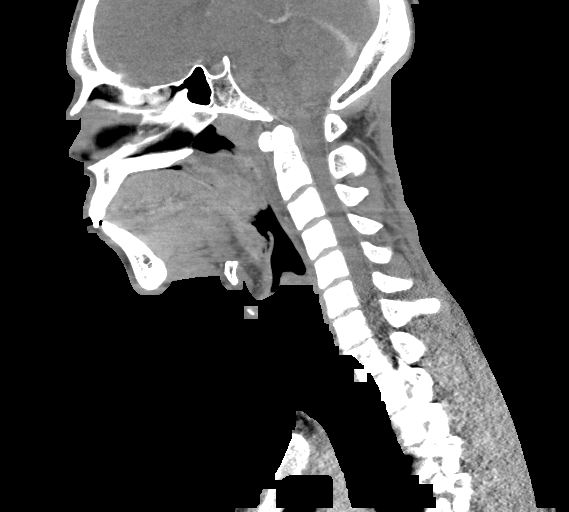
[im 52/104  bone]
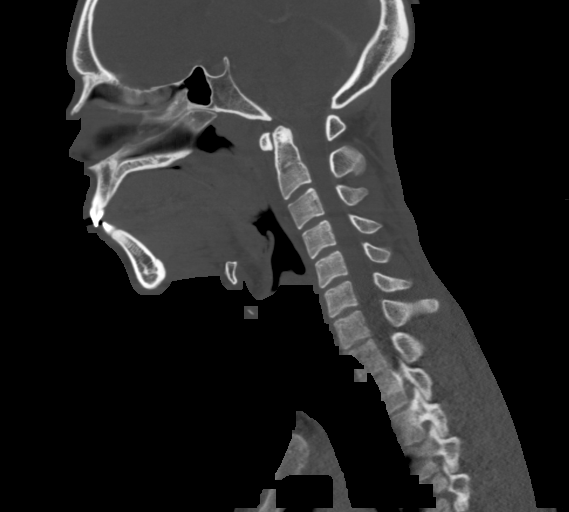
[im 61/104  bone]
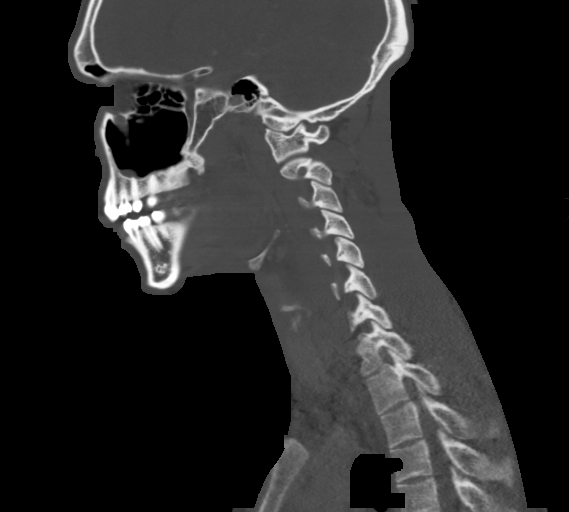
[im 69/104  bone]
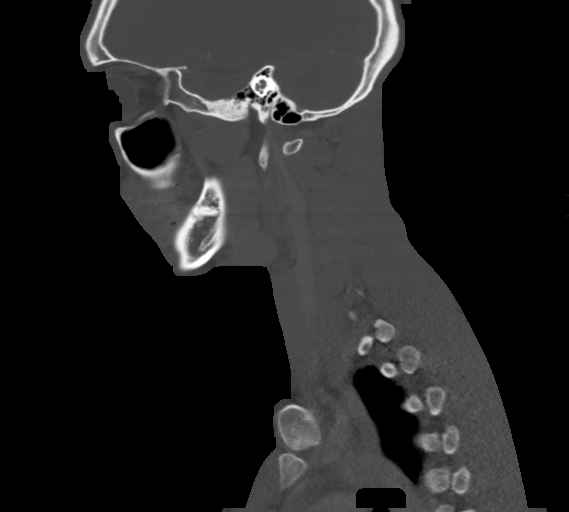

[Series 8: cor neck · coronal · 0.40mm/px · 3 of 171 slices shown]
[im 35/171  bone]
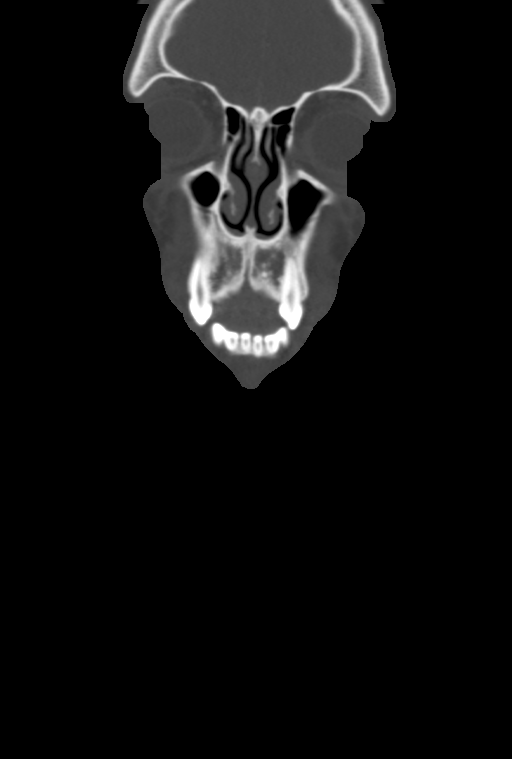
[im 69/171  bone]
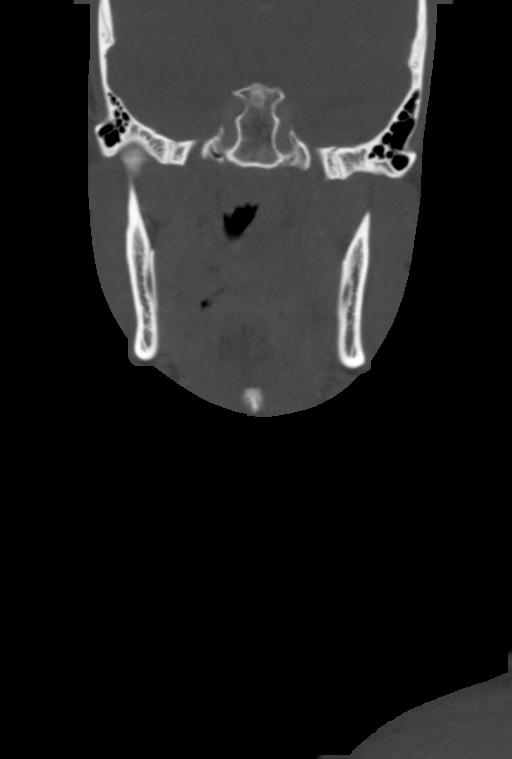
[im 103/171  bone]
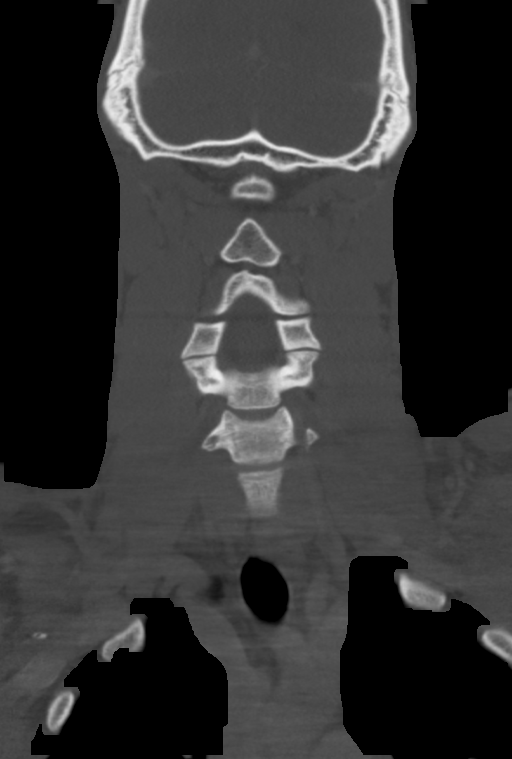

[Series 9: orthogonal ax · axial · 0.40mm/px · 1 of 154 slices shown]
[im 31/154  bone]
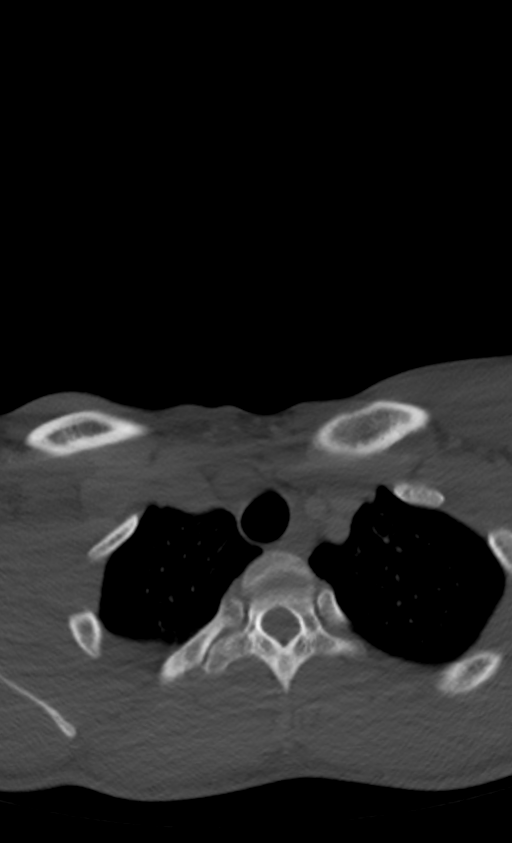

[13 of 33 positions shown; findings below may reference images not displayed]

FINDINGS: PHARYNX AND LARYNX:

--Nasopharynx: Fossae of Florentzos are clear. Normal adenoid
tonsils for age.

--Oral cavity and oropharynx: The left palatine tonsil is markedly
enlarged with heterogeneous internal density including a developing
collection measuring 1.8 x 1.0 cm. Lingual tonsils are also
enlarged, but without fluid collection.

--Hypopharynx: Normal vallecula and pyriform sinuses.

--Larynx: Normal epiglottis and pre-epiglottic space. Normal
aryepiglottic and vocal folds.

--Retropharyngeal space: No abscess, effusion or lymphadenopathy.

SALIVARY GLANDS:

--Parotid: No mass lesion or inflammation. No sialolithiasis or
ductal dilatation.

--Submandibular: Symmetric without inflammation. No sialolithiasis
or ductal dilatation.

--Sublingual: Normal. No ranula or other visible lesion of the base
of tongue and floor of mouth.

THYROID: Normal.

LYMPH NODES: Reactive level 2A lymph nodes measure up to 10 mm.

VASCULAR: Major cervical vessels are patent.

LIMITED INTRACRANIAL: Normal.

VISUALIZED ORBITS: Normal.

MASTOIDS AND VISUALIZED PARANASAL SINUSES: No fluid levels or
advanced mucosal thickening. No mastoid effusion.

SKELETON: No bony spinal canal stenosis. No lytic or blastic
lesions.

UPPER CHEST: Clear.

OTHER: None.
IMPRESSION: Left palatine tonsillitis with 18 x 10 mm collection, consistent
with developing peritonsillar abscess. No airway compromise.

## 2019-04-23 ENCOUNTER — Other Ambulatory Visit: Payer: Self-pay

## 2019-04-23 ENCOUNTER — Encounter (HOSPITAL_BASED_OUTPATIENT_CLINIC_OR_DEPARTMENT_OTHER): Payer: Self-pay | Admitting: *Deleted

## 2019-04-23 NOTE — H&P (Signed)
  Dylan Rivera is an 18 y.o. male.   Chief Complaint: Recurrent tonsillitis and recurrent peritonsillar abscess HPI: History of sore throat for the past 4 to 5 days.  History of peritonsillar abscess twice before.  Recommendation was made last year for tonsillectomy but he was not able to do it.  Otherwise in good health.  Past Medical History:  Diagnosis Date  . Seizures ([facility])     Past Surgical History:  Procedure Laterality Date  . INCISION AND DRAINAGE OF PERITONSILLAR ABCESS Left 08/24/2018   Procedure: INCISION AND DRAINAGE OF PERITONSILLAR LEFT ABCESS;  Surgeon: Flo Shanks, MD;  Location: [facility] OR;  Service: ENT;  Laterality: Left;    No family history on file. Social History:  reports that he has never smoked. He has never used smokeless tobacco. He reports that he does not drink alcohol or use drugs.  Allergies: No Known Allergies  No medications prior to admission.    No results found for this or any previous visit (from the past 48 hour(s)). No results found.  ROS: otherwise negative  There were no vitals taken for this visit.  PHYSICAL EXAM: Overall appearance:  Healthy appearing, in no distress Head:  Normocephalic, atraumatic. Ears: External auditory canals are clear; tympanic membranes are intact and the middle ears are free of any effusion. Nose: External nose is healthy in appearance. Internal nasal exam free of any lesions or obstruction. Oral Cavity/pharynx:  There are no mucosal lesions or masses identified.  Tonsils are symmetrically inflamed with surface exudate. Hypopharynx/Larynx: no signs of any mucosal lesions or masses identified. Vocal cords move normally. Neuro:  No identifiable neurologic deficits. Neck: No palpable neck masses.  Studies Reviewed: none    Assessment/Plan Recurrent tonsillitis, 2 episodes of peritonsillar abscess.  Recommend tonsillectomy.  Serena Colonel 04/23/2019, 9:39 AM

## 2019-04-29 ENCOUNTER — Other Ambulatory Visit (HOSPITAL_COMMUNITY)
Admission: RE | Admit: 2019-04-29 | Discharge: 2019-04-29 | Disposition: A | Discharge status: Home / Self Care | Payer: Medicaid Other | Source: Ambulatory Visit | Attending: Otolaryngology | Admitting: Otolaryngology

## 2019-04-29 DIAGNOSIS — Z1159 Encounter for screening for other viral diseases: Secondary | ICD-10-CM | POA: Insufficient documentation

## 2019-04-29 LAB — SARS CORONAVIRUS 2 BY RT PCR (HOSPITAL ORDER, PERFORMED IN ~~LOC~~ HOSPITAL LAB): SARS Coronavirus 2: NEGATIVE

## 2019-04-30 ENCOUNTER — Ambulatory Visit (HOSPITAL_BASED_OUTPATIENT_CLINIC_OR_DEPARTMENT_OTHER)
Admission: RE | Admit: 2019-04-30 | Discharge: 2019-04-30 | Disposition: A | Discharge status: Home / Self Care | Payer: BLUE CROSS/BLUE SHIELD | Attending: Otolaryngology | Admitting: Otolaryngology

## 2019-04-30 ENCOUNTER — Ambulatory Visit (HOSPITAL_BASED_OUTPATIENT_CLINIC_OR_DEPARTMENT_OTHER): Payer: BLUE CROSS/BLUE SHIELD | Admitting: Anesthesiology

## 2019-04-30 ENCOUNTER — Encounter (HOSPITAL_BASED_OUTPATIENT_CLINIC_OR_DEPARTMENT_OTHER): Payer: Self-pay | Admitting: Anesthesiology

## 2019-04-30 ENCOUNTER — Other Ambulatory Visit: Payer: Self-pay

## 2019-04-30 ENCOUNTER — Encounter (HOSPITAL_BASED_OUTPATIENT_CLINIC_OR_DEPARTMENT_OTHER)
Admission: RE | Disposition: A | Discharge status: Home / Self Care | Payer: Self-pay | Source: Home / Self Care | Attending: Otolaryngology

## 2019-04-30 DIAGNOSIS — J36 Peritonsillar abscess: Secondary | ICD-10-CM | POA: Insufficient documentation

## 2019-04-30 DIAGNOSIS — Z8669 Personal history of other diseases of the nervous system and sense organs: Secondary | ICD-10-CM | POA: Diagnosis not present

## 2019-04-30 DIAGNOSIS — Z9089 Acquired absence of other organs: Secondary | ICD-10-CM

## 2019-04-30 HISTORY — PX: TONSILLECTOMY: SHX5217

## 2019-04-30 SURGERY — TONSILLECTOMY
Anesthesia: General | Site: Throat | Laterality: Bilateral

## 2019-04-30 MED ORDER — HYDROMORPHONE HCL 1 MG/ML IJ SOLN
INTRAMUSCULAR | Status: AC
  Filled 2019-04-30: qty 0.5

## 2019-04-30 MED ORDER — MIDAZOLAM HCL 2 MG/2ML IJ SOLN
INTRAMUSCULAR | Status: AC
  Filled 2019-04-30: qty 2

## 2019-04-30 MED ORDER — PROMETHAZINE HCL 25 MG RE SUPP: 25.0000 mg | Freq: Four times a day (QID) | RECTAL | Status: DC | PRN

## 2019-04-30 MED ORDER — PROPOFOL 500 MG/50ML IV EMUL
INTRAVENOUS | Status: DC | PRN
  Administered 2019-04-30: 100 ug/kg/min via INTRAVENOUS

## 2019-04-30 MED ORDER — FENTANYL CITRATE (PF) 100 MCG/2ML IJ SOLN
50.0000 ug | INTRAMUSCULAR | Status: AC | PRN
  Administered 2019-04-30: 50 ug via INTRAVENOUS
  Administered 2019-04-30: 100 ug via INTRAVENOUS
  Administered 2019-04-30: 50 ug via INTRAVENOUS

## 2019-04-30 MED ORDER — HYDROCODONE-ACETAMINOPHEN 7.5-325 MG/15ML PO SOLN
10.0000 mL | ORAL | Status: DC | PRN
  Administered 2019-04-30: 15 mL via ORAL

## 2019-04-30 MED ORDER — MEPERIDINE HCL 25 MG/ML IJ SOLN: 6.2500 mg | INTRAMUSCULAR | Status: DC | PRN

## 2019-04-30 MED ORDER — HYDROCODONE-ACETAMINOPHEN 7.5-325 MG/15ML PO SOLN
ORAL | Status: AC
  Filled 2019-04-30: qty 15

## 2019-04-30 MED ORDER — FENTANYL CITRATE (PF) 100 MCG/2ML IJ SOLN
INTRAMUSCULAR | Status: AC
  Filled 2019-04-30: qty 2

## 2019-04-30 MED ORDER — SCOPOLAMINE 1 MG/3DAYS TD PT72: 1.0000 | MEDICATED_PATCH | Freq: Once | TRANSDERMAL | Status: DC | PRN

## 2019-04-30 MED ORDER — MIDAZOLAM HCL 2 MG/2ML IJ SOLN
1.0000 mg | INTRAMUSCULAR | Status: DC | PRN
  Administered 2019-04-30: 2 mg via INTRAVENOUS

## 2019-04-30 MED ORDER — OXYCODONE HCL 5 MG PO TABS: 5.0000 mg | ORAL_TABLET | Freq: Once | ORAL | Status: DC | PRN

## 2019-04-30 MED ORDER — PROMETHAZINE HCL 25 MG PO TABS: 25.0000 mg | ORAL_TABLET | Freq: Four times a day (QID) | ORAL | Status: DC | PRN

## 2019-04-30 MED ORDER — LIDOCAINE 2% (20 MG/ML) 5 ML SYRINGE
INTRAMUSCULAR | Status: DC | PRN
  Administered 2019-04-30: 80 mg via INTRAVENOUS

## 2019-04-30 MED ORDER — HYDROCODONE-ACETAMINOPHEN 7.5-325 MG/15ML PO SOLN: 15.0000 mL | Freq: Four times a day (QID) | ORAL | 0 refills | Status: AC | PRN

## 2019-04-30 MED ORDER — DEXAMETHASONE SODIUM PHOSPHATE 10 MG/ML IJ SOLN
INTRAMUSCULAR | Status: AC
  Filled 2019-04-30: qty 1

## 2019-04-30 MED ORDER — LACTATED RINGERS IV SOLN
INTRAVENOUS | Status: DC
  Administered 2019-04-30 (×2): via INTRAVENOUS

## 2019-04-30 MED ORDER — IBUPROFEN 100 MG/5ML PO SUSP: 400.0000 mg | Freq: Four times a day (QID) | ORAL | Status: DC | PRN

## 2019-04-30 MED ORDER — PROPOFOL 10 MG/ML IV BOLUS
INTRAVENOUS | Status: DC | PRN
  Administered 2019-04-30: 20 mg via INTRAVENOUS
  Administered 2019-04-30: 200 mg via INTRAVENOUS

## 2019-04-30 MED ORDER — OXYCODONE HCL 5 MG/5ML PO SOLN: 5.0000 mg | Freq: Once | ORAL | Status: DC | PRN

## 2019-04-30 MED ORDER — SUCCINYLCHOLINE CHLORIDE 20 MG/ML IJ SOLN
INTRAMUSCULAR | Status: DC | PRN
  Administered 2019-04-30: 100 mg via INTRAVENOUS

## 2019-04-30 MED ORDER — PROPOFOL 500 MG/50ML IV EMUL
INTRAVENOUS | Status: AC
  Filled 2019-04-30: qty 50

## 2019-04-30 MED ORDER — HYDROMORPHONE HCL 1 MG/ML IJ SOLN
0.2500 mg | INTRAMUSCULAR | Status: DC | PRN
  Administered 2019-04-30: 0.5 mg via INTRAVENOUS

## 2019-04-30 MED ORDER — ONDANSETRON HCL 4 MG/2ML IJ SOLN
INTRAMUSCULAR | Status: AC
  Filled 2019-04-30: qty 2

## 2019-04-30 MED ORDER — PHENOL 1.4 % MT LIQD: 1.0000 | OROMUCOSAL | Status: DC | PRN

## 2019-04-30 MED ORDER — PROMETHAZINE HCL 25 MG RE SUPP: 25.0000 mg | Freq: Four times a day (QID) | RECTAL | 1 refills | Status: AC | PRN

## 2019-04-30 MED ORDER — ONDANSETRON HCL 4 MG/2ML IJ SOLN
INTRAMUSCULAR | Status: DC | PRN
  Administered 2019-04-30: 4 mg via INTRAVENOUS

## 2019-04-30 MED ORDER — LIDOCAINE 2% (20 MG/ML) 5 ML SYRINGE
INTRAMUSCULAR | Status: AC
  Filled 2019-04-30: qty 5

## 2019-04-30 MED ORDER — DEXTROSE-NACL 5-0.9 % IV SOLN: INTRAVENOUS | Status: DC

## 2019-04-30 MED ORDER — ONDANSETRON HCL 4 MG/2ML IJ SOLN: 4.0000 mg | Freq: Once | INTRAMUSCULAR | Status: DC | PRN

## 2019-04-30 MED ORDER — PROPOFOL 500 MG/50ML IV EMUL: INTRAVENOUS | Status: DC | PRN

## 2019-04-30 SURGICAL SUPPLY — 31 items
CANISTER SUCT 1200ML W/VALVE (MISCELLANEOUS) ×3 IMPLANT
CATH ROBINSON RED A/P 12FR (CATHETERS) ×3 IMPLANT
COAGULATOR SUCT 6 FR SWTCH (ELECTROSURGICAL) ×1
COAGULATOR SUCT SWTCH 10FR 6 (ELECTROSURGICAL) ×2 IMPLANT
COVER BACK TABLE REUSABLE LG (DRAPES) ×3 IMPLANT
COVER MAYO STAND REUSABLE (DRAPES) ×3 IMPLANT
COVER WAND RF STERILE (DRAPES) IMPLANT
ELECT COATED BLADE 2.86 ST (ELECTRODE) ×3 IMPLANT
ELECT REM PT RETURN 9FT ADLT (ELECTROSURGICAL) ×3
ELECT REM PT RETURN 9FT PED (ELECTROSURGICAL)
ELECTRODE REM PT RETRN 9FT PED (ELECTROSURGICAL) IMPLANT
ELECTRODE REM PT RTRN 9FT ADLT (ELECTROSURGICAL) ×1 IMPLANT
GAUZE SPONGE 4X4 12PLY STRL LF (GAUZE/BANDAGES/DRESSINGS) ×3 IMPLANT
GLOVE BIOGEL PI IND STRL 7.0 (GLOVE) ×1 IMPLANT
GLOVE BIOGEL PI INDICATOR 7.0 (GLOVE) ×2
GLOVE ECLIPSE 7.5 STRL STRAW (GLOVE) ×3 IMPLANT
GOWN STRL REUS W/ TWL LRG LVL3 (GOWN DISPOSABLE) ×2 IMPLANT
GOWN STRL REUS W/TWL LRG LVL3 (GOWN DISPOSABLE) ×4
MARKER SKIN DUAL TIP RULER LAB (MISCELLANEOUS) IMPLANT
NS IRRIG 1000ML POUR BTL (IV SOLUTION) ×3 IMPLANT
PENCIL FOOT CONTROL (ELECTRODE) ×3 IMPLANT
SHEET MEDIUM DRAPE 40X70 STRL (DRAPES) ×3 IMPLANT
SOLUTION BUTLER CLEAR DIP (MISCELLANEOUS) IMPLANT
SPONGE TONSIL TAPE 1 RFD (DISPOSABLE) IMPLANT
SPONGE TONSIL TAPE 1.25 RFD (DISPOSABLE) ×3 IMPLANT
SYR BULB 3OZ (MISCELLANEOUS) ×3 IMPLANT
TOWEL GREEN STERILE FF (TOWEL DISPOSABLE) ×3 IMPLANT
TUBE CONNECTING 20'X1/4 (TUBING) ×1
TUBE CONNECTING 20X1/4 (TUBING) ×2 IMPLANT
TUBE SALEM SUMP 12R W/ARV (TUBING) IMPLANT
TUBE SALEM SUMP 16 FR W/ARV (TUBING) ×3 IMPLANT

## 2019-04-30 NOTE — Op Note (Signed)
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 Following induction of general endotracheal anesthesia, the table was turned and the patient was draped in a standard fashion. A Crowe-Davis mouthgag was inserted into the oral cavity and used to retract the tongue and mandible, then attached to the Mayo stand.  The tonsillectomy was then performed using electrocautery dissection, carefully dissecting the avascular plane between the capsule and constrictor muscles. Cautery was used for completion of hemostasis. The tonsils were very large and cryptic , and were discarded.  The pharynx was irrigated with saline and suctioned. An oral gastric tube was used to aspirate the contents of the stomach. The patient was then awakened from anesthesia and transferred to PACU in stable condition.   PATIENT DISPOSITION:  To PACA, stable

## 2019-04-30 NOTE — Transfer of Care (Signed)
Immediate Anesthesia Transfer of Care Note  Patient: Dylan Rivera, Dylan Rivera  Procedure(s) Performed: TONSILLECTOMY (Bilateral Throat)  Patient Location: PACU  Anesthesia Type:General  Level of Consciousness: sedated  Airway & Oxygen Therapy: Patient Spontanous Breathing and Patient connected to nasal cannula oxygen  Post-op Assessment: Report given to RN and Post -op Vital signs reviewed and stable  Post vital signs: Reviewed and stable  Last Vitals:  Vitals Value Taken Time  BP 126/77 04/30/2019  9:10 AM  Temp    Pulse 80 04/30/2019  9:13 AM  Resp 16 04/30/2019  9:13 AM  SpO2 100 % 04/30/2019  9:13 AM  Vitals shown include unvalidated device data.  Last Pain:  Vitals:   04/30/19 0748  TempSrc: Oral  PainSc: 0-No pain         Complications: No apparent anesthesia complications

## 2019-04-30 NOTE — Anesthesia Preprocedure Evaluation (Signed)
Anesthesia Evaluation  Patient identified by MRN, date of birth, ID band Patient awake    Reviewed: Allergy & Precautions, NPO status , Patient's Chart, lab work & pertinent test results  Airway Mallampati: II  TM Distance: >3 FB Neck ROM: Full    Dental no notable dental hx. (+) Teeth Intact   Pulmonary  Hx/o left peritonsillar abscess   Pulmonary exam normal breath sounds clear to auscultation       Cardiovascular negative cardio ROS Normal cardiovascular exam Rhythm:Regular Rate:Normal     Neuro/Psych Seizures -,  Last Hx/o years ago  negative psych ROS   GI/Hepatic negative GI ROS, Neg liver ROS,   Endo/Other  negative endocrine ROS  Renal/GU negative Renal ROS  negative genitourinary   Musculoskeletal negative musculoskeletal ROS (+)   Abdominal   Peds  Hematology negative hematology ROS (+)   Anesthesia Other Findings   Reproductive/Obstetrics                             Anesthesia Physical Anesthesia Plan  ASA: II  Anesthesia Plan: General   Post-op Pain Management:    Induction: Intravenous  PONV Risk Score and Plan: 3 and Ondansetron, Treatment may vary due to age or medical condition, Dexamethasone, Midazolam and Scopolamine patch - Pre-op  Airway Management Planned: Oral ETT  Additional Equipment:   Intra-op Plan:   Post-operative Plan: Extubation in OR  Informed Consent: I have reviewed the patients History and Physical, chart, labs and discussed the procedure including the risks, benefits and alternatives for the proposed anesthesia with the patient or authorized representative who has indicated his/her understanding and acceptance.     Dental advisory given  Plan Discussed with: CRNA and Surgeon  Anesthesia Plan Comments:         Anesthesia Quick Evaluation

## 2019-04-30 NOTE — Anesthesia Procedure Notes (Signed)
Procedure Name: Intubation Date/Time: 04/30/2019 8:29 AM Performed by: Maryella Shivers, CRNA Pre-anesthesia Checklist: Patient identified, Emergency Drugs available, Suction available and Patient being monitored Patient Re-evaluated:Patient Re-evaluated prior to induction Oxygen Delivery Method: Circle system utilized Preoxygenation: Pre-oxygenation with 100% oxygen Induction Type: IV induction Ventilation: Mask ventilation without difficulty Laryngoscope Size: Mac and 4 Grade View: Grade I Tube type: Oral Tube size: 8.0 mm Number of attempts: 1 Airway Equipment and Method: Stylet and Oral airway Placement Confirmation: ETT inserted through vocal cords under direct vision,  positive ETCO2 and breath sounds checked- equal and bilateral Secured at: 23 cm Tube secured with: Tape Dental Injury: Teeth and Oropharynx as per pre-operative assessment

## 2019-04-30 NOTE — Anesthesia Postprocedure Evaluation (Signed)
Anesthesia Post Note  Patient: [name], museum/gallery  Procedure(s) Performed: TONSILLECTOMY (Bilateral Throat)     Patient location during evaluation: PACU Anesthesia Type: General Level of consciousness: awake and alert and oriented Pain management: pain level controlled Vital Signs Assessment: post-procedure vital signs reviewed and stable Respiratory status: spontaneous breathing, nonlabored ventilation and respiratory function stable Cardiovascular status: blood pressure returned to baseline and stable Postop Assessment: no apparent nausea or vomiting Anesthetic complications: no    Last Vitals:  Vitals:   04/30/19 0930 04/30/19 0945  BP: 121/77 (!) 142/84  Pulse: 80 64  Resp: 17 11  Temp: 36.4 C   SpO2: 98% 100%    Last Pain:  Vitals:   04/30/19 0945  TempSrc:   PainSc: 5                  Ramonda Galyon A.

## 2019-04-30 NOTE — Interval H&P Note (Signed)
History and Physical Interval Note:  04/30/2019 8:08 AM  Dylan Rivera  has presented today for surgery, with the diagnosis of periotonsillar abscess.  The various methods of treatment have been discussed with the patient and family. After consideration of risks, benefits and other options for treatment, the patient has consented to  Procedure(s): TONSILLECTOMY (Bilateral) as a surgical intervention.  The patient's history has been reviewed, patient examined, no change in status, stable for surgery.  I have reviewed the patient's chart and labs.  Questions were answered to the patient's satisfaction.     Serena Colonel

## 2019-04-30 NOTE — Discharge Instructions (Signed)
Tonsillectomy, Adult, Care After This sheet gives you information about how to care for yourself after your procedure. Your doctor may also give you more specific instructions. If you have problems or questions, contact your doctor. Follow these instructions at home: Eating and drinking   Follow instructions from your doctor about eating and drinking.  For many days after surgery, choose foods that are soft and cold. Examples are: ? Gelatin. ? Sherbet. ? Ice cream. ? Frozen ice pops.  If you have an upset stomach (are nauseous), choose liquids that are cold and that you can see through (clear liquids). Examples are water and apple juice without pulp. You can try thick liquids and soft foods when you can eat without throwing up (vomiting) and without too much pain. Examples are: ? Creamed soups. ? Soft, warm cereals, such as oatmeal or hot wheat cereal. ? Milk. ? Mashed potatoes. ? Applesauce.  Drink enough fluid to keep your pee (urine) clear or pale yellow. Driving  Do not drive for 24 hours if you were given a medicine to help you relax (sedative).  Do not drive or use heavy machinery while taking prescription pain medicine or until your health care provider approves. General instructions  Rest.  Keep your head raised (elevated) when lying down.  Take medicines only as told by your doctor. These include over-the-counter medicines and prescription medicines.  Do not use mouthwashes until your doctor says it is okay.  Gargle only as told by your doctor.  Stay away from people who are sick. Contact a doctor if:  Your pain gets worse or medicines do not help.  You have a fever.  You have a rash.  You feel light-headed or you pass out (faint).  You cannot swallow even a little liquid or spit (saliva).  Your pee is very dark. Get help right away if:  You have trouble breathing.  You bleed bright red blood from your throat.  You throw up bright red  blood. Summary  Follow instructions from your doctor about what you cannot eat or drink. For many days after surgery, choose foods that are soft and cold.  Talk with your doctor about how to keep your pain under control. This can help you rest and swallow better.  Get help right away if you bleed bright red blood from your throat or you throw up bright red blood. This information is not intended to replace advice given to you by your health care provider. Make sure you discuss any questions you have with your health care provider. Document Released: 12/23/2010 Document Revised: 10/13/2016 Document Reviewed: 10/13/2016 Elsevier Interactive Patient Education  2019 [employer].  Post Anesthesia Home Care Instructions  Activity: Get plenty of rest for the remainder of the day. A responsible individual must stay with you for 24 hours following the procedure.  For the next 24 hours, DO NOT: -Drive a car -Advertising copywriter -Drink alcoholic beverages -Take any medication unless instructed by your physician -Make any legal decisions or sign important papers.  Meals: Start with liquid foods such as gelatin or soup. Progress to regular foods as tolerated. Avoid greasy, spicy, heavy foods. If nausea and/or vomiting occur, drink only clear liquids until the nausea and/or vomiting subsides. Call your physician if vomiting continues.  Special Instructions/Symptoms: Your throat may feel dry or sore from the anesthesia or the breathing tube placed in your throat during surgery. If this causes discomfort, gargle with warm salt water. The discomfort should disappear within 24 hours.  If you had a scopolamine patch placed behind your ear for the management of post- operative nausea and/or vomiting:  1. The medication in the patch is effective for 72 hours, after which it should be removed.  Wrap patch in a tissue and discard in the trash. Wash hands thoroughly with soap and water. 2. You may remove the  patch earlier than 72 hours if you experience unpleasant side effects which may include dry mouth, dizziness or visual disturbances. 3. Avoid touching the patch. Wash your hands with soap and water after contact with the patch.

## 2019-05-01 ENCOUNTER — Encounter (HOSPITAL_BASED_OUTPATIENT_CLINIC_OR_DEPARTMENT_OTHER): Payer: Self-pay | Admitting: Otolaryngology

## 2020-01-15 IMAGING — CT CT NECK W/ CM
4 of 6 series · 14 of 33 positions shown, 16 images · IV contrast (iopamidol)
Comparison: 05/28/2018

CLINICAL DATA: Sore throat with stridor, tonsillitis suspected.

EXAM:
CT NECK WITH CONTRAST
TECHNIQUE: Multidetector CT imaging of the neck was performed using the
standard protocol following the bolus administration of intravenous
contrast.
CONTRAST:  75mL MO25W8-UYY IOPAMIDOL (MO25W8-UYY) INJECTION 61%

[Series 3: axial neck · axial · 0.65mm/px · z∈[+845,+945]mm · 2 of 150 slices shown]
[im 50/150  bone]
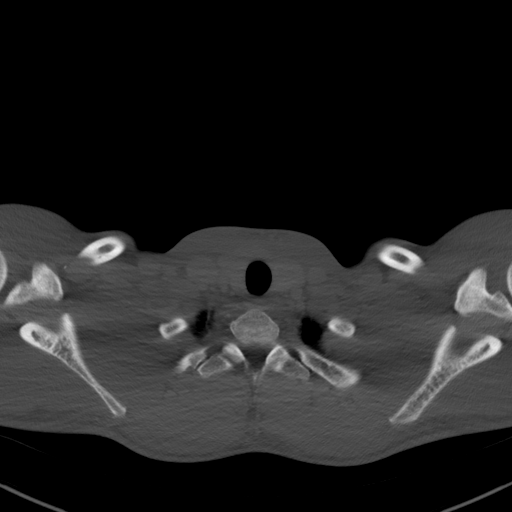
[im 100/150  bone]
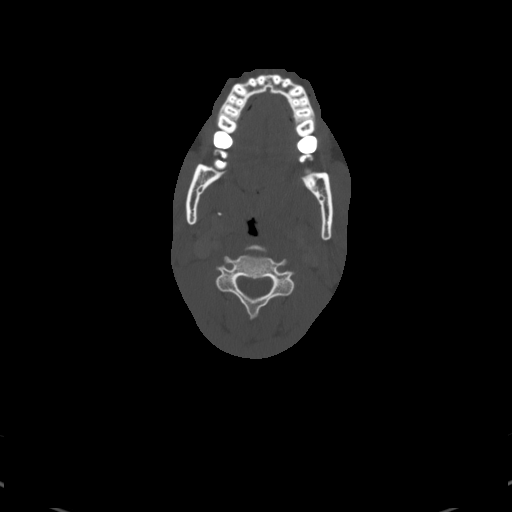

[Series 5: thins · axial · 0.65mm/px · z∈[+806,+985]mm · 4 of 299 slices shown, 5 images]
[im 60/299  soft-tissue]
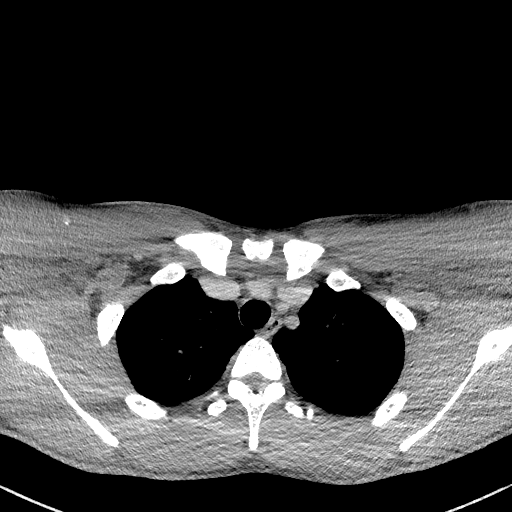
[im 60/299  bone]
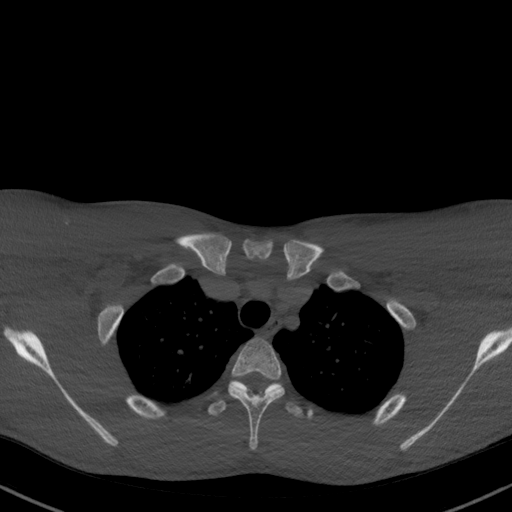
[im 120/299  bone]
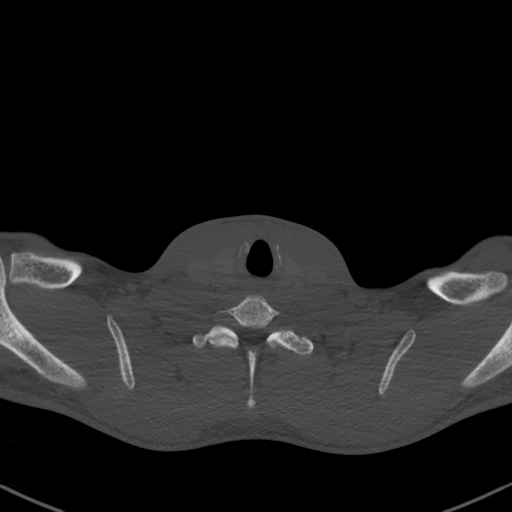
[im 179/299  bone]
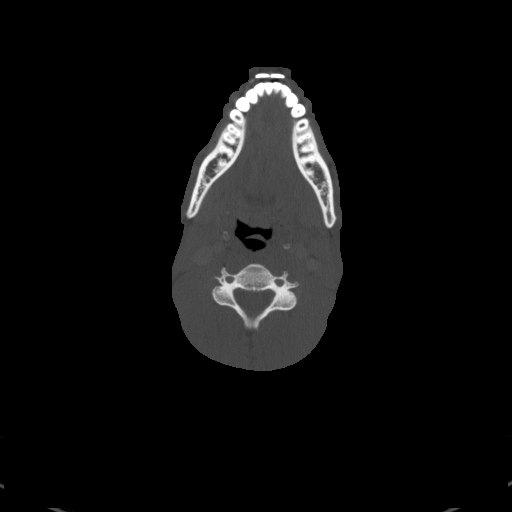
[im 239/299  bone]
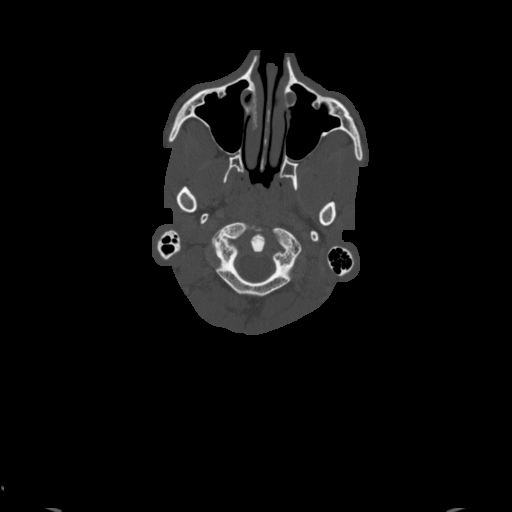

[Series 7: sag neck · sagittal · 0.62mm/px · 5 of 118 slices shown, 6 images]
[im 40/118  bone]
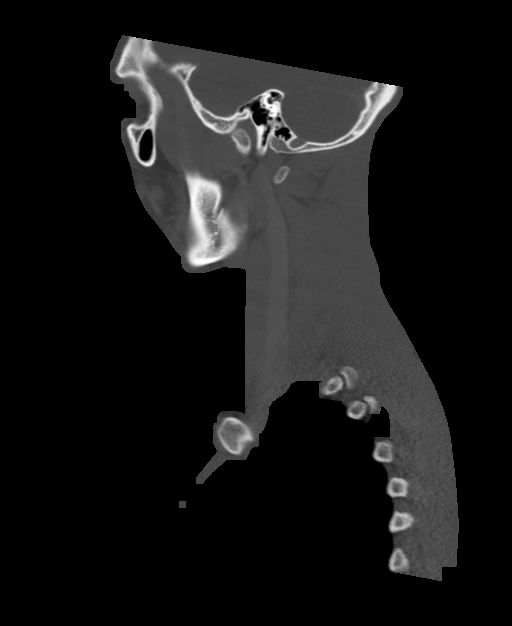
[im 49/118  bone]
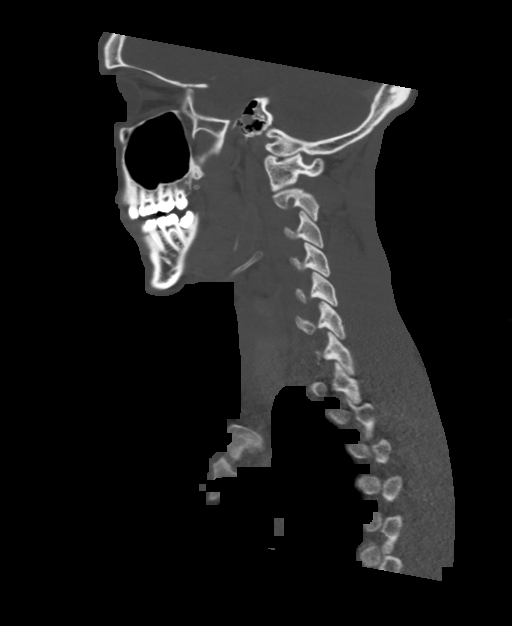
[im 59/118  soft-tissue]
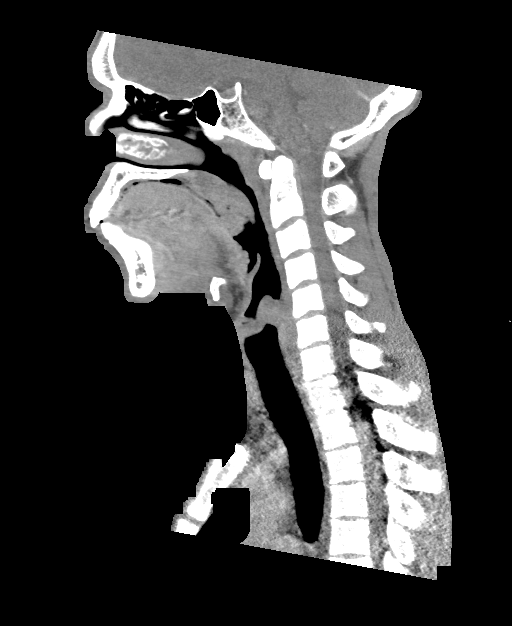
[im 59/118  bone]
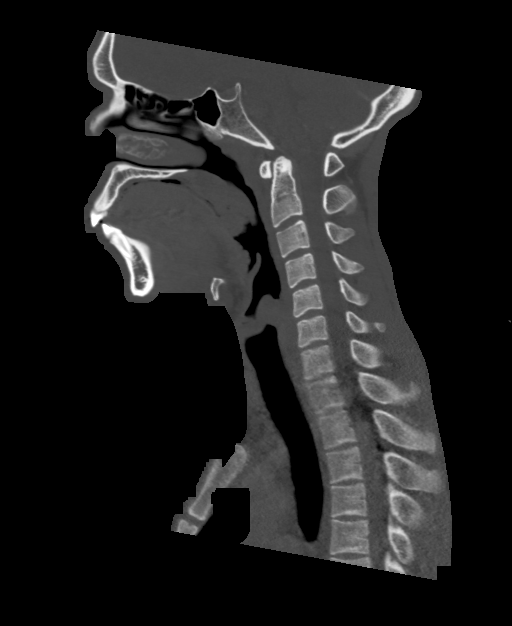
[im 69/118  bone]
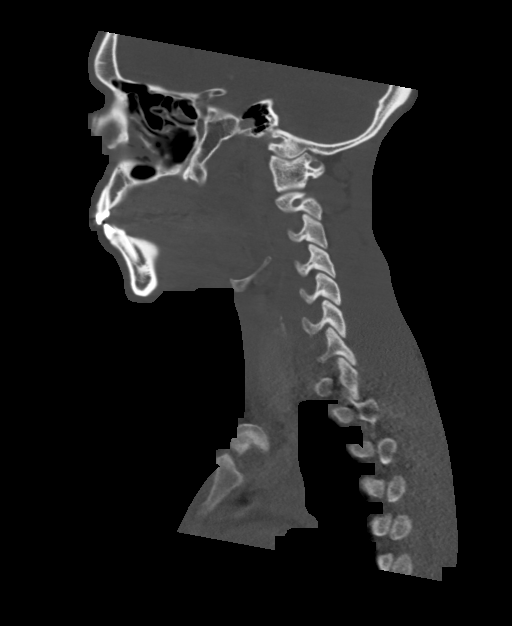
[im 79/118  bone]
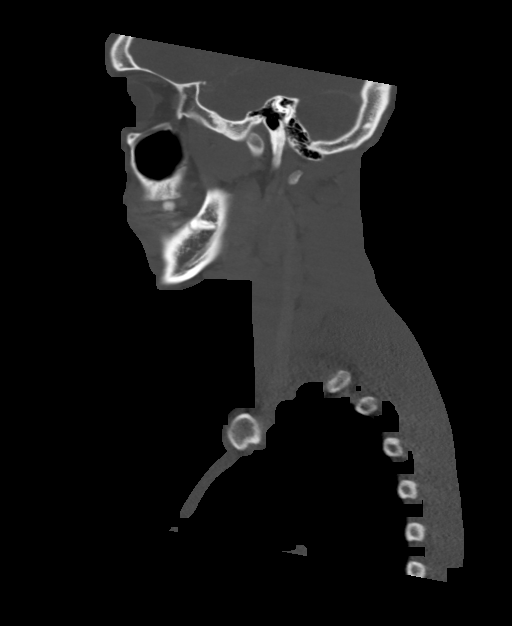

[Series 8: cor neck · coronal · 0.55mm/px · 3 of 102 slices shown]
[im 21/102  bone]
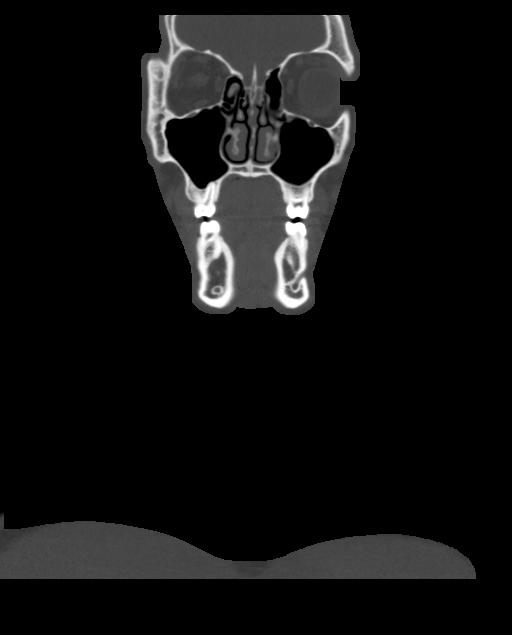
[im 41/102  bone]
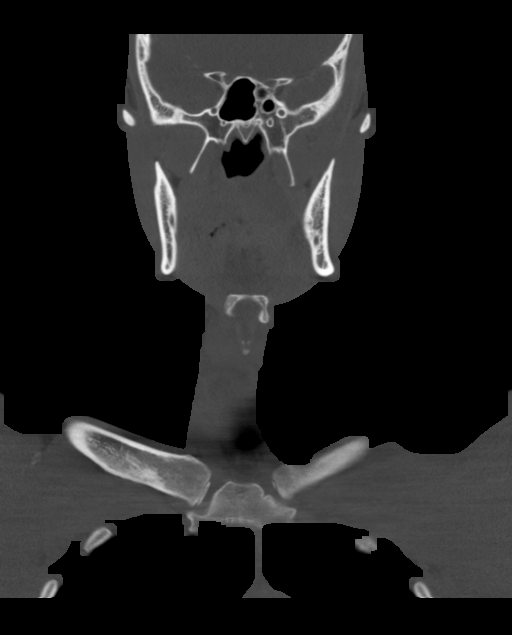
[im 61/102  bone]
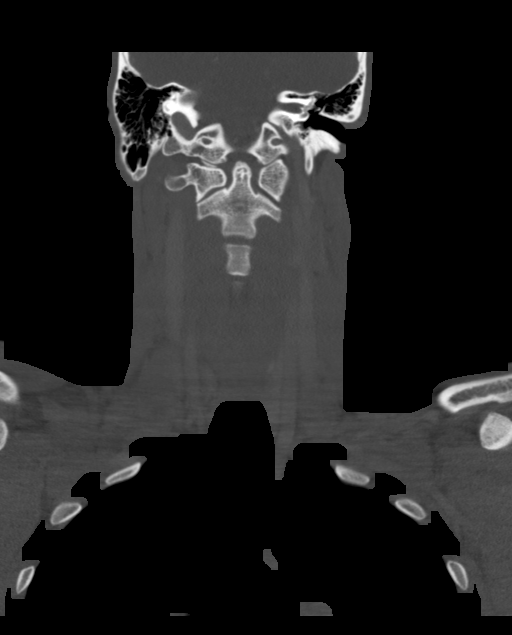

[14 of 33 positions shown; findings below may reference images not displayed]

FINDINGS: Pharynx and larynx: BILATERAL tonsillar enlargement, greater on the
LEFT. Developing peritonsillar abscess, measuring roughly 2 x 3 x 3
cm. Parapharyngeal fat is effaced. The lesion extends cephalad
towards the nasopharynx. Soft palate and uvula are edematous.
Epiglottis is intact. No retropharyngeal fluid collection or airway
compromise.

Salivary glands: No inflammation, mass, or stone.

Thyroid: Normal.

Lymph nodes: Reactive cervical lymph nodes, greatest in LEFT level
II.

Vascular: Negative.

Limited intracranial: Negative.

Visualized orbits: Negative.

Mastoids and visualized paranasal sinuses: Clear.

Skeleton: No acute or aggressive process.

Upper chest: Negative.

Other: None.
IMPRESSION: BILATERAL tonsillar enlargement and inflammation. Developing LEFT
peritonsillar abscess, larger than priors, roughly 2 x 3 x 3 cm. ENT
consultation is warranted.

These results were called by telephone at the time of interpretation
on 08/24/2018 at [DATE] to NAT MEECH , who verbally
acknowledged these results.

## 2021-10-30 ENCOUNTER — Emergency Department (HOSPITAL_BASED_OUTPATIENT_CLINIC_OR_DEPARTMENT_OTHER)
Admission: EM | Admit: 2021-10-30 | Discharge: 2021-10-30 | Disposition: A | Discharge status: Home / Self Care | Payer: BC Managed Care – PPO | Attending: Emergency Medicine | Admitting: Emergency Medicine

## 2021-10-30 ENCOUNTER — Encounter (HOSPITAL_BASED_OUTPATIENT_CLINIC_OR_DEPARTMENT_OTHER): Payer: Self-pay

## 2021-10-30 ENCOUNTER — Other Ambulatory Visit: Payer: Self-pay

## 2021-10-30 DIAGNOSIS — L03011 Cellulitis of right finger: Secondary | ICD-10-CM | POA: Diagnosis not present

## 2021-10-30 DIAGNOSIS — M7989 Other specified soft tissue disorders: Secondary | ICD-10-CM | POA: Diagnosis present

## 2021-10-30 MED ORDER — BACITRACIN ZINC 500 UNIT/GM EX OINT: 1.0000 "application " | TOPICAL_OINTMENT | Freq: Two times a day (BID) | CUTANEOUS | 0 refills | Status: AC

## 2021-10-30 MED ORDER — AMOXICILLIN-POT CLAVULANATE 875-125 MG PO TABS: 1.0000 | ORAL_TABLET | Freq: Two times a day (BID) | ORAL | 0 refills | Status: AC

## 2021-10-30 NOTE — ED Provider Notes (Signed)
[facility] EMERGENCY DEPARTMENT Provider Note   CSN: [id number] Arrival date & time: 10/30/21  1549     History Chief Complaint  Patient presents with   Hand Pain    Dylan Rivera is a 20 y.o. male.  HPI  20 year old male presents the emergency department today for evaluation of pain and swelling to the right middle for this started about 4 days ago.  He also has some discoloration to the finger as well.  He has had no fevers.  He does bite his nails.  Pain is constant and severe in nature it is worse with palpation.  Past Medical History:  Diagnosis Date   Seizures (HCC)    last seizure in middle school    Patient Active Problem List   Diagnosis Date Noted   S/P tonsillectomy 04/30/2019    Past Surgical History:  Procedure Laterality Date   INCISION AND DRAINAGE OF PERITONSILLAR ABCESS Left 08/24/2018   Procedure: INCISION AND DRAINAGE OF PERITONSILLAR LEFT ABCESS;  Surgeon: Flo Shanks, MD;  Location: [facility] OR;  Service: ENT;  Laterality: Left;   TONSILLECTOMY Bilateral 04/30/2019   Procedure: TONSILLECTOMY;  Surgeon: Serena Colonel, MD;  Location: [facility];  Service: ENT;  Laterality: Bilateral;       History reviewed. No pertinent family history.  Social History   Tobacco Use   Smoking status: Never   Smokeless tobacco: Never  Vaping Use   Vaping Use: Never used  Substance Use Topics   Alcohol use: Never   Drug use: Never    Home Medications Prior to Admission medications   Medication Sig Start Date End Date Taking? Authorizing Provider  amoxicillin-clavulanate (AUGMENTIN) 875-125 MG tablet Take 1 tablet by mouth 2 (two) times daily for 7 days. 10/30/21 11/06/21 Yes Emmelia Holdsworth S, PA-C  bacitracin ointment Apply 1 application topically 2 (two) times daily. 10/30/21  Yes Shaida Route S, PA-C  HYDROcodone-acetaminophen (HYCET) 7.5-325 mg/15 ml solution Take 15 mLs by mouth every 6 (six) hours as needed for moderate pain.  04/30/19   Serena Colonel, MD  ibuprofen (ADVIL) 200 MG tablet Take 200 mg by mouth every 6 (six) hours as needed.    Alver Fisher, RN  promethazine (PHENERGAN) 25 MG suppository Place 1 suppository (25 mg total) rectally every 6 (six) hours as needed for nausea or vomiting. 04/30/19   Serena Colonel, MD    Allergies    Patient has no known allergies.  Review of Systems   Review of Systems  Constitutional:  Negative for fever.  Musculoskeletal:        Finger pain  Skin:  Positive for color change.   Physical Exam Updated Vital Signs BP 140/83 (BP Location: Left Arm)   Pulse 76   Temp 98.2 F (36.8 C) (Oral)   Resp 18   Ht 6\' 2"  (1.88 m)   Wt 94.3 kg   SpO2 100%   BMI 26.71 kg/m   Physical Exam Constitutional:      General: He is not in acute distress.    Appearance: He is well-developed.  Eyes:     Conjunctiva/sclera: Conjunctivae normal.  Cardiovascular:     Rate and Rhythm: Normal rate.  Pulmonary:     Effort: Pulmonary effort is normal.  Musculoskeletal:     Comments: Paronychia noted to the right middle finger.  No felon noted.  Brisk cap refill distally and normal sensation.  Skin:    General: Skin is warm and dry.  Neurological:  Mental Status: He is alert and oriented to person, place, and time.    ED Results / Procedures / Treatments   Labs (all labs ordered are listed, but only abnormal results are displayed) Labs Reviewed - No data to display  EKG None  Radiology No results found.  Procedures .Marland KitchenIncision and Drainage  Date/Time: 10/30/2021 6:07 PM Performed by: Karrie Meres, PA-C Authorized by: Karrie Meres, PA-C   Consent:    Consent obtained:  Verbal   Consent given by:  Patient   Risks, benefits, and alternatives were discussed: yes     Risks discussed:  Bleeding   Alternatives discussed:  No treatment Universal protocol:    Procedure explained and questions answered to patient or proxy's satisfaction: yes     Patient  identity confirmed:  Verbally with patient Location:    Type:  Abscess (paronychia)   Size:  Less than 0.5cm Pre-procedure details:    Skin preparation:  Povidone-iodine Anesthesia:    Anesthesia method:  Topical application   Topical anesthesia: pain ease spray. Procedure type:    Complexity:  Simple Procedure details:    Ultrasound guidance: no     Needle aspiration: no     Incision types:  Stab incision   Incision depth:  Dermal   Drainage:  Purulent   Drainage amount:  Scant   Wound treatment:  Wound left open   Packing materials:  None Post-procedure details:    Procedure completion:  Tolerated   Medications Ordered in ED Medications - No data to display  ED Course  I have reviewed the triage vital signs and the nursing notes.  Pertinent labs & imaging results that were available during my care of the patient were reviewed by me and considered in my medical decision making (see chart for details).    MDM Rules/Calculators/A&P                          20 year old male presenting the emergency department today for evaluation of paronychia to the right middle finger.  The area was incised and drained and there was a mild amount of purulent output.  He was given bacitracin for symptoms and was discharged with Augmentin should he not have improvement of symptoms in the next 24 to 48 hours.  Advised on return precautions.  He voices understanding the plan and reasons to return.  Questions answered.  Patient stable for discharge   Final Clinical Impression(s) / ED Diagnoses Final diagnoses:  Paronychia of right middle finger    Rx / DC Orders ED Discharge Orders          Ordered    bacitracin ointment  2 times daily        10/30/21 1806    amoxicillin-clavulanate (AUGMENTIN) 875-125 MG tablet  2 times daily        10/30/21 [address], PA-C 10/30/21 1808    Maia Plan, MD 10/31/21 1258

## 2021-10-30 NOTE — ED Triage Notes (Signed)
R middle finger pain and swelling around fingernail x4 days

## 2021-10-30 NOTE — ED Notes (Signed)
EDPA at [facility]. I&D of R middle finger paronychia in progress. Tolerating well.

## 2021-10-30 NOTE — Discharge Instructions (Signed)
Use bacitracin twice daily and soak the finger in soapy water 3 times a day  If you do not experience improvement of your symptoms he can fill the antibiotic and take it as directed.  If you have any new or worsening symptoms he is return to the emergency department immediately.

## 2023-01-21 ENCOUNTER — Emergency Department (HOSPITAL_BASED_OUTPATIENT_CLINIC_OR_DEPARTMENT_OTHER): Payer: Medicaid Other

## 2023-01-21 ENCOUNTER — Emergency Department (HOSPITAL_BASED_OUTPATIENT_CLINIC_OR_DEPARTMENT_OTHER)
Admission: EM | Admit: 2023-01-21 | Discharge: 2023-01-21 | Disposition: A | Discharge status: Home / Self Care | Payer: Medicaid Other | Attending: Emergency Medicine | Admitting: Emergency Medicine

## 2023-01-21 ENCOUNTER — Other Ambulatory Visit: Payer: Self-pay

## 2023-01-21 ENCOUNTER — Encounter (HOSPITAL_BASED_OUTPATIENT_CLINIC_OR_DEPARTMENT_OTHER): Payer: Self-pay | Admitting: Emergency Medicine

## 2023-01-21 DIAGNOSIS — R1084 Generalized abdominal pain: Secondary | ICD-10-CM | POA: Diagnosis not present

## 2023-01-21 DIAGNOSIS — R079 Chest pain, unspecified: Secondary | ICD-10-CM | POA: Diagnosis not present

## 2023-01-21 DIAGNOSIS — R109 Unspecified abdominal pain: Secondary | ICD-10-CM | POA: Diagnosis present

## 2023-01-21 LAB — COMPREHENSIVE METABOLIC PANEL
ALT: 31 U/L (ref 0–44)
AST: 30 U/L (ref 15–41)
Albumin: 4.2 g/dL (ref 3.5–5.0)
Alkaline Phosphatase: 60 U/L (ref 38–126)
Anion gap: 5 (ref 5–15)
BUN: 13 mg/dL (ref 6–20)
CO2: 25 mmol/L (ref 22–32)
Calcium: 8.6 mg/dL — ABNORMAL LOW (ref 8.9–10.3)
Chloride: 104 mmol/L (ref 98–111)
Creatinine, Ser: 1.11 mg/dL (ref 0.61–1.24)
GFR, Estimated: >60 mL/min (ref >60)
Glucose, Bld: 87 mg/dL (ref 70–99)
Potassium: 3.9 mmol/L (ref 3.5–5.1)
Sodium: 134 mmol/L — ABNORMAL LOW (ref 135–145)
Total Bilirubin: 1.6 mg/dL — ABNORMAL HIGH (ref 0.3–1.2)
Total Protein: 7.4 g/dL (ref 6.5–8.1)

## 2023-01-21 LAB — URINALYSIS, ROUTINE W REFLEX MICROSCOPIC
Bilirubin Urine: NEGATIVE
Glucose, UA: NEGATIVE mg/dL
Hgb urine dipstick: NEGATIVE
Ketones, ur: NEGATIVE mg/dL
Leukocytes,Ua: NEGATIVE
Nitrite: NEGATIVE
Protein, ur: NEGATIVE mg/dL
Specific Gravity, Urine: >=1.03 (ref 1.005–1.030)
pH: 6 (ref 5.0–8.0)

## 2023-01-21 LAB — CBC
HCT: 42.9 % (ref 39.0–52.0)
Hemoglobin: 14.8 g/dL (ref 13.0–17.0)
MCH: 28.5 pg (ref 26.0–34.0)
MCHC: 34.5 g/dL (ref 30.0–36.0)
MCV: 82.7 fL (ref 80.0–100.0)
Platelets: 289 10*3/uL (ref 150–400)
RBC: 5.19 MIL/uL (ref 4.22–5.81)
RDW: 11.8 % (ref 11.5–15.5)
WBC: 5.6 10*3/uL (ref 4.0–10.5)
nRBC: 0 % (ref 0.0–0.2)

## 2023-01-21 LAB — LIPASE, BLOOD: Lipase: 60 U/L — ABNORMAL HIGH (ref 11–51)

## 2023-01-21 MED ORDER — SODIUM CHLORIDE 0.9 % IV BOLUS
1000.0000 mL | Freq: Once | INTRAVENOUS | Status: AC
  Administered 2023-01-21: 1000 mL via INTRAVENOUS

## 2023-01-21 MED ORDER — IOHEXOL 300 MG/ML  SOLN
100.0000 mL | Freq: Once | INTRAMUSCULAR | Status: AC | PRN
  Administered 2023-01-21: 100 mL via INTRAVENOUS

## 2023-01-21 NOTE — ED Provider Notes (Signed)
[place] EMERGENCY DEPARTMENT AT [facility] Provider Note   CSN: [id number] Arrival date & time: 01/21/23  1430     History  Chief Complaint  Patient presents with   Abdominal Pain   Chest Pain    Dylan Rivera is a 22 y.o. male.  Pt reports he began having nausea, vomiting and diarrhea on Thursday.  Pt reports last vomited Thursday.  No diarrhea since Friday Pt complains of discomfort to upper abdomen .  Pt complains of feeling swollen,  Pt reports pain in abdomen into chest.  Pt denies fever or chills  no uti symptoms    The history is provided by the patient. No language interpreter was used.  Abdominal Pain Pain location:  Epigastric, RUQ and LUQ Pain quality: aching and bloating   Pain radiates to:  Does not radiate Pain severity:  Moderate Onset quality:  Gradual Duration:  4 days Timing:  Constant Chronicity:  New Context: not alcohol use   Relieved by:  Nothing Worsened by:  Nothing Ineffective treatments:  None tried Associated symptoms: chest pain   Associated symptoms: no fever   Risk factors: no alcohol abuse, has not had multiple surgeries and no NSAID use   Chest Pain Associated symptoms: abdominal pain   Associated symptoms: no fever        Home Medications Prior to Admission medications   Medication Sig Start Date End Date Taking? Authorizing Provider  bacitracin ointment Apply 1 application topically 2 (two) times daily. 10/30/21   Couture, Cortni S, PA-C  HYDROcodone-acetaminophen (HYCET) 7.5-325 mg/15 ml solution Take 15 mLs by mouth every 6 (six) hours as needed for moderate pain. 04/30/19   Izora Gala, MD  ibuprofen (ADVIL) 200 MG tablet Take 200 mg by mouth every 6 (six) hours as needed.    Joline Salt, RN  promethazine (PHENERGAN) 25 MG suppository Place 1 suppository (25 mg total) rectally every 6 (six) hours as needed for nausea or vomiting. 04/30/19   Izora Gala, MD      Allergies    Patient has no known allergies.     Review of Systems   Review of Systems  Constitutional:  Negative for fever.  Cardiovascular:  Positive for chest pain.  Gastrointestinal:  Positive for abdominal pain.  All other systems reviewed and are negative.   Physical Exam Updated Vital Signs BP (!) 152/56 (BP Location: Right Arm)   Pulse 61   Temp 98 F (36.7 C) (Oral)   Resp 17   Ht 6' 2"$  (1.88 m)   Wt 99.8 kg   SpO2 100%   BMI 28.25 kg/m  Physical Exam Vitals and nursing note reviewed.  Constitutional:      Appearance: He is well-developed.  HENT:     Head: Normocephalic.  Cardiovascular:     Rate and Rhythm: Normal rate and regular rhythm.  Pulmonary:     Effort: Pulmonary effort is normal.  Abdominal:     General: Abdomen is flat. There is no distension.     Tenderness: There is abdominal tenderness in the right upper quadrant and left upper quadrant.  Musculoskeletal:        General: Normal range of motion.     Cervical back: Normal range of motion.  Skin:    General: Skin is warm.  Neurological:     Mental Status: He is alert and oriented to person, place, and time.  Psychiatric:        Mood and Affect: Mood normal.  ED Results / Procedures / Treatments   Labs (all labs ordered are listed, but only abnormal results are displayed) Labs Reviewed  LIPASE, BLOOD - Abnormal; Notable for the following components:      Result Value   Lipase 60 (*)    All other components within normal limits  COMPREHENSIVE METABOLIC PANEL - Abnormal; Notable for the following components:   Sodium 134 (*)    Calcium 8.6 (*)    Total Bilirubin 1.6 (*)    All other components within normal limits  CBC  URINALYSIS, ROUTINE W REFLEX MICROSCOPIC    EKG EKG Interpretation  Date/Time:  Sunday January 21 2023 14:41:55 EST Ventricular Rate:  68 PR Interval:  133 QRS Duration: 79 QT Interval:  353 QTC Calculation: 376 R Axis:   84 Text Interpretation: Sinus rhythm ST elevation, consider anterolateral injury  No previous ECGs available Confirmed by Wandra Arthurs [phone]) on 01/21/2023 6:50:02 PM  Radiology CT ABDOMEN PELVIS W CONTRAST  Result Date: 01/21/2023 CLINICAL DATA:  Abdominal pain, nausea/vomiting/diarrhea, bloating EXAM: CT ABDOMEN AND PELVIS WITH CONTRAST TECHNIQUE: Multidetector CT imaging of the abdomen and pelvis was performed using the standard protocol following bolus administration of intravenous contrast. RADIATION DOSE REDUCTION: This exam was performed according to the departmental dose-optimization program which includes automated exposure control, adjustment of the mA and/or kV according to patient size and/or use of iterative reconstruction technique. CONTRAST:  174m OMNIPAQUE IOHEXOL 300 MG/ML  SOLN COMPARISON:  None Available. FINDINGS: Lower chest: No acute pleural or parenchymal lung disease. Hepatobiliary: No focal liver abnormality is seen. No gallstones, gallbladder wall thickening, or biliary dilatation. Pancreas: Unremarkable. No pancreatic ductal dilatation or surrounding inflammatory changes. Spleen: Normal in size without focal abnormality. Adrenals/Urinary Tract: Adrenal glands are unremarkable. Kidneys are normal, without renal calculi, focal lesion, or hydronephrosis. Bladder is unremarkable. Stomach/Bowel: No evidence of bowel obstruction. Minimally distended loops of jejunum in the left upper quadrant measure up to 3.1 cm in diameter, with scattered gas fluid levels suggesting regional ileus. Normal appendix right lower quadrant. No bowel wall thickening or inflammatory change. Vascular/Lymphatic: No significant vascular findings are present. Subcentimeter lymph nodes of the central mesentery are likely reactive. No enlarged abdominal or pelvic lymph nodes. Reproductive: Prostate is unremarkable. Other: No free fluid or free intraperitoneal gas. No abdominal wall hernia. Musculoskeletal: No acute or destructive bony lesions. Reconstructed images demonstrate no additional  findings. IMPRESSION: 1. Minimally distended loops of jejunum within the left upper quadrant, with scattered gas fluid levels as above. There is no evidence of high-grade obstruction, and findings could reflect regional ileus and underlying gastroenteritis. Continued radiographic follow-up may be useful. 2. Subcentimeter central mesenteric lymph nodes are likely reactive. No pathologic adenopathy. 3. Normal appendix. Electronically Signed   By: MRanda NgoM.D.   On: 01/21/2023 19:52    Procedures Procedures    Medications Ordered in ED Medications  sodium chloride 0.9 % bolus 1,000 mL (1,000 mLs Intravenous Bolus 01/21/23 1926)  iohexol (OMNIPAQUE) 300 MG/ML solution 100 mL (100 mLs Intravenous Contrast Given 01/21/23 1937)    ED Course/ Medical Decision Making/ A&P                             Medical Decision Making Pt complains of vomiting and diarrhea on Thursday.  Pt reports last episode of diarrhea Friday am.  Pt reports increasing abdominal pain   Amount and/or Complexity of Data Reviewed Labs: ordered. Decision-making details documented  in ED Course.    Details: Labs ordered reviewed and interpreted.   Pt has an elevated lipase of 60. Bilirubin  1.6   Radiology: ordered and independent interpretation performed. Decision-making details documented in ED Course.    Details: Ct scan shows minimally distended loops of jejunum  Discussion of management or test interpretation with external provider(s):  I discussed pt with Dr. Brantley Stage who reviewed pt's ct scan.  He advised Ct shows no obstruction.  He advised probable gastroenteritis.  He also reported pt has increased stool.   Risk OTC drugs. Risk Details: Pt counseled on results.  Pt advised to recheck with primary crae or here in 12 hours.  Pt advised miralax.             Final Clinical Impression(s) / ED Diagnoses Final diagnoses:  Generalized abdominal pain    Rx / DC Orders ED Discharge Orders     None       An After Visit Summary was printed and given to the patient.    Sidney Ace 01/21/23 2320    Drenda Freeze, MD 01/21/23 [phone]

## 2023-01-21 NOTE — Discharge Instructions (Signed)
See your Physician for recheck tomorrow or return here for recheck.  Try taking a dose of miralax for the next 3 days Return if symptoms worsen or change.

## 2023-01-21 NOTE — ED Triage Notes (Signed)
Pt c/o abd pain, NVD that started on Thurs pm; reports abd swelling/bloated since then; c/o burning sensation to mid chest, belching a lot
